# Patient Record
Sex: Female | Born: 1965 | Race: White | Hispanic: No
Health system: Midwestern US, Academic
[De-identification: ages and names within clinical notes are randomized; demographics above are authoritative.]

---

## 2018-05-31 LAB — SED RATE: Lab: 17

## 2018-05-31 LAB — C REACTIVE PROT-HI SENSITIVITY: Lab: 3.4

## 2018-06-20 ENCOUNTER — Encounter: Admit: 2018-06-20 | Discharge: 2018-06-20 | Payer: Commercial Managed Care - HMO | Primary: Family

## 2018-07-03 IMAGING — US DUPRENAL
1 series · 14 of 25 positions shown · non-contrast
Comparison: none

[Series 1: us duplex renal artery · 14 of 54 slices shown]
[im 1/54]
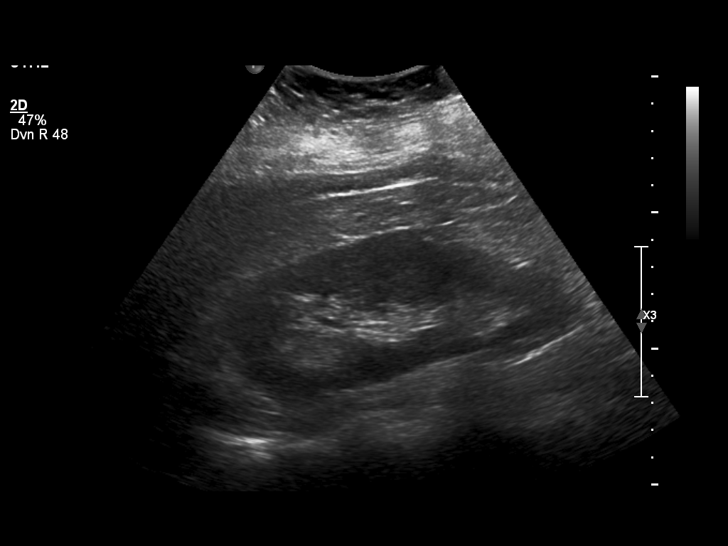
[im 5/54]
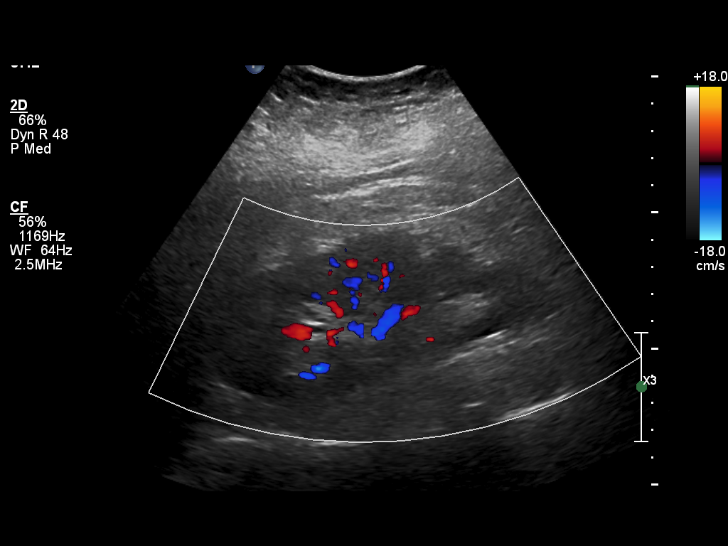
[im 9/54]
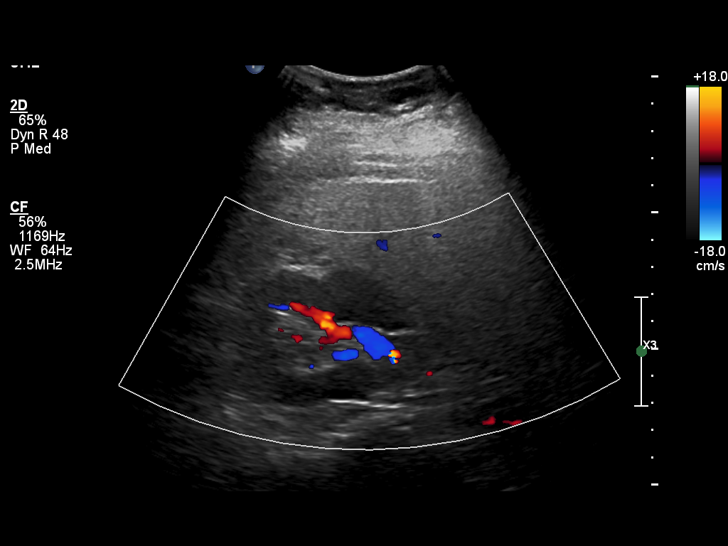
[im 14/54]
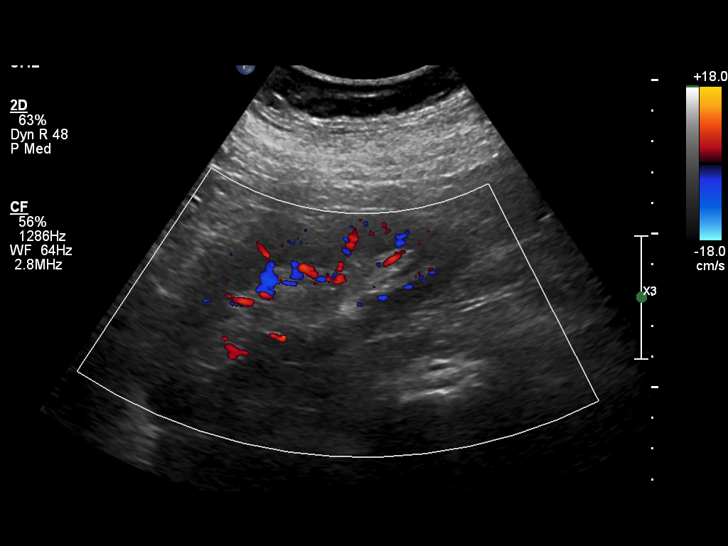
[im 18/54]
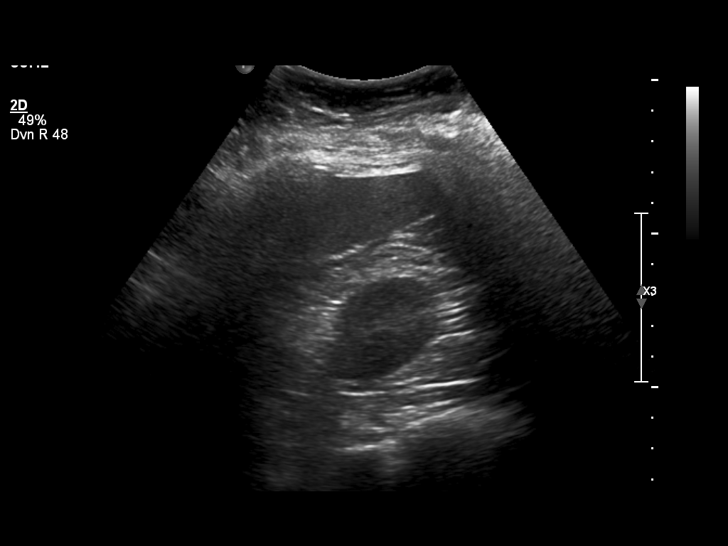
[im 20/54]
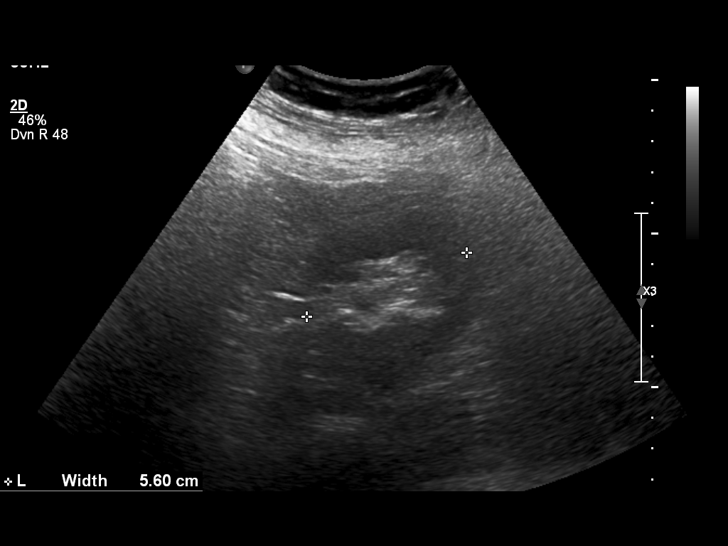
[im 25/54]
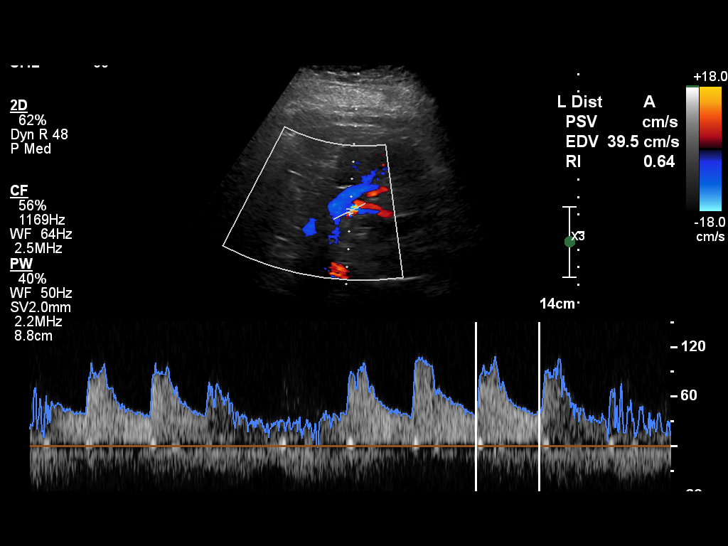
[im 29/54]
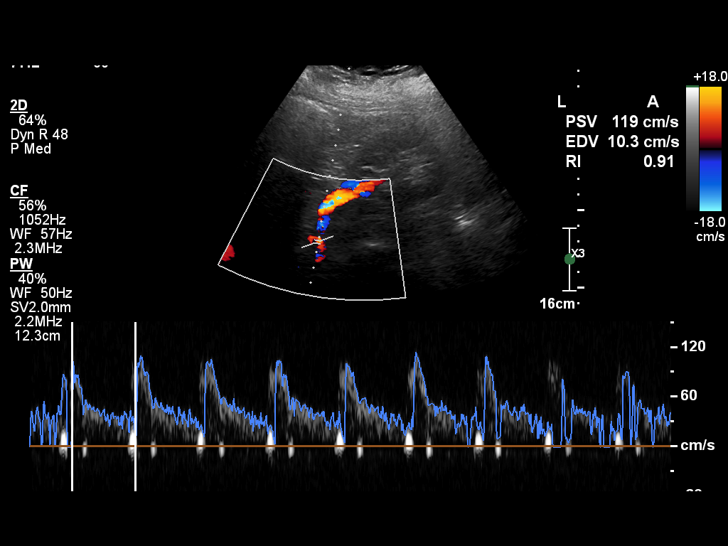
[im 34/54]
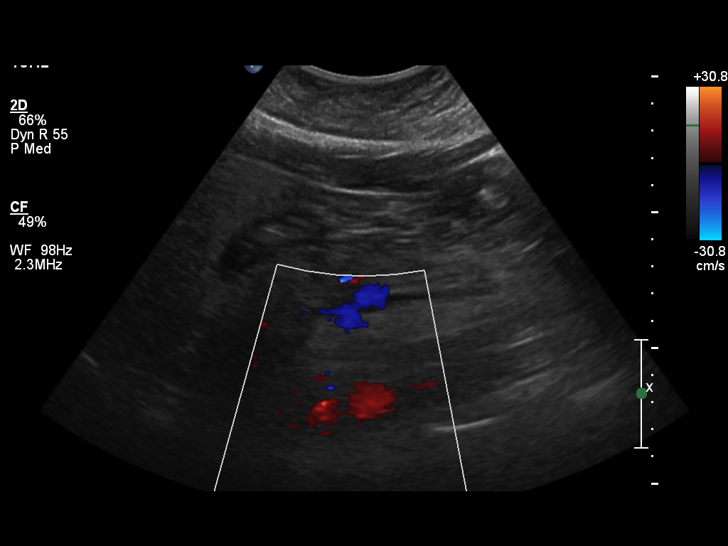
[im 36/54]
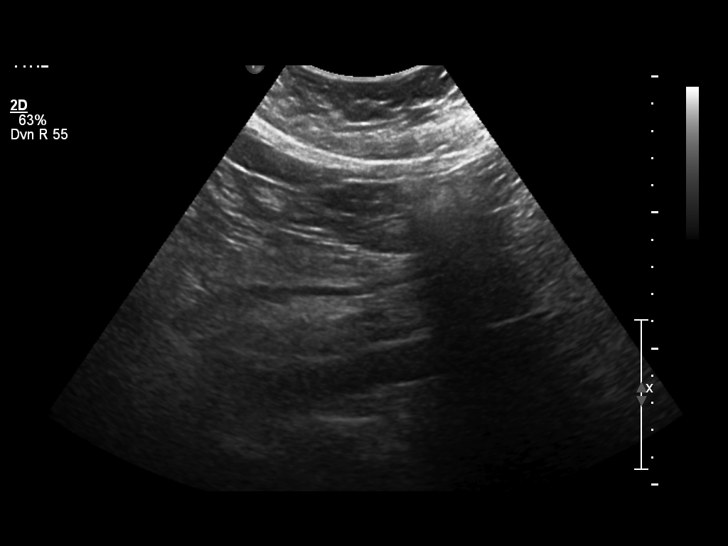
[im 40/54]
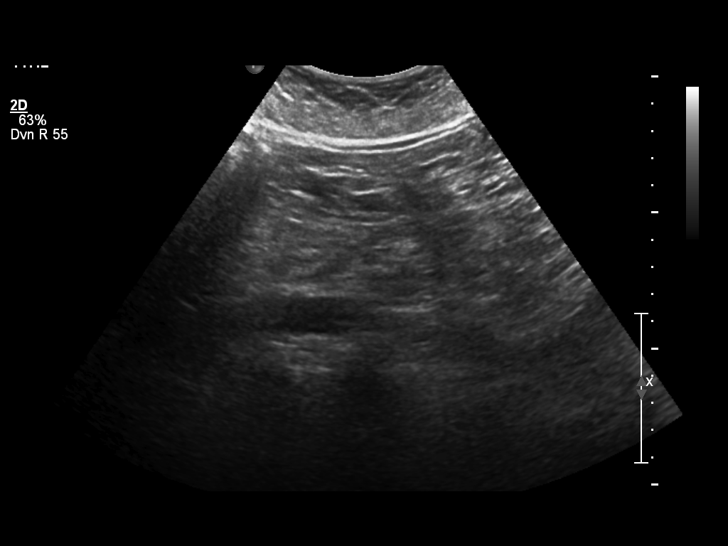
[im 45/54]
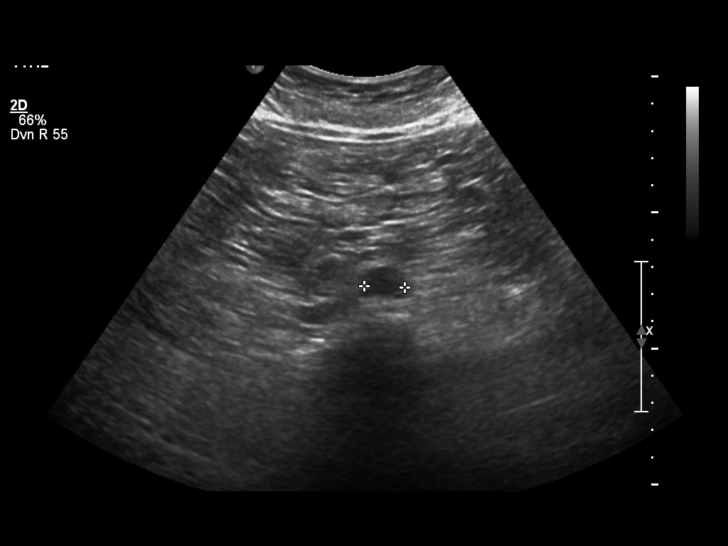
[im 49/54]
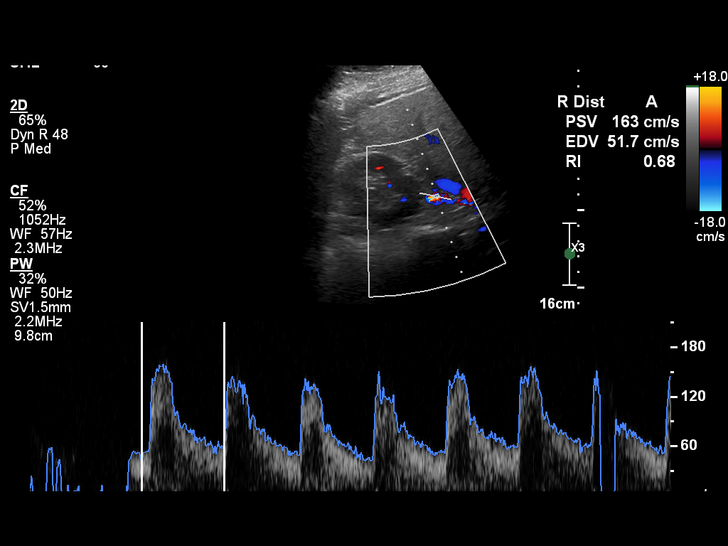
[im 54/54]
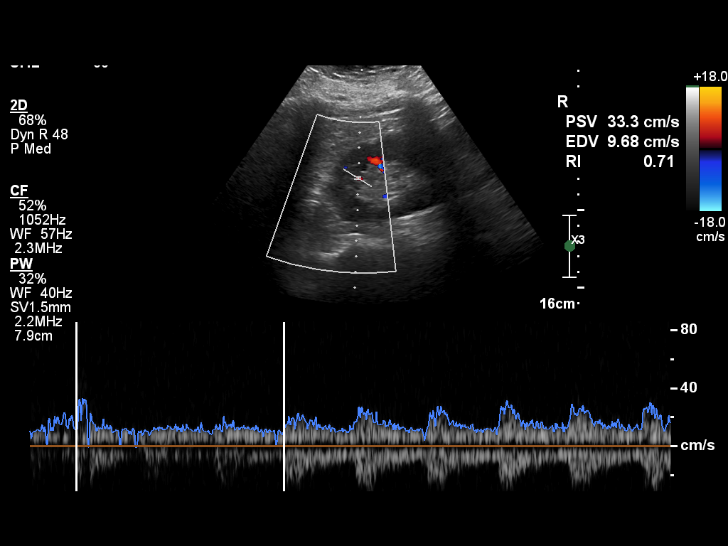

[14 of 25 positions shown; findings below may reference images not displayed]

EXAM
Duplex renal artery ultrasound.

INDICATION
uncontrolled HTN
jl/ao

TECHNIQUE
High-resolution grayscale, color Doppler, and duplex interrogation of the kidneys and renal
arteries.

COMPARISONS
No prior studies are available for comparison.

FINDINGS
The right kidney measures 12.9 x 5.3 x 6.0 centimeters and the left kidney measures 12.3 x 5.0 x
5.6 centimeters. There is no hydronephrosis or renal mass.
Peak systolic velocity measurements are within normal limits, maximal at the origin of the right
renal artery peak systolic velocity documented at 175 centimeters/second correlating with a renal
artery to aorta ratio 1.93. The renal vein is documented to be patent bilaterally.

IMPRESSION
No acute renal pathology. No evidence of hemodynamically significant stenosis of the renal
arteries.

Tech Notes:

jl/ao

## 2019-01-11 LAB — BASIC METABOLIC PANEL
Lab: 1
Lab: 125 — ABNORMAL HIGH (ref 70–105)
Lab: 13
Lab: 140
Lab: 16
Lab: 25
Lab: 61
Lab: 8.6

## 2019-01-11 LAB — THYROID STIMULATING HORMONE-TSH: Lab: 0.2 — ABNORMAL LOW (ref 0.35–4.94)

## 2019-01-11 LAB — FREE T4 (FREE THYROXINE) ONLY: Lab: 1

## 2019-01-19 LAB — COMPREHENSIVE METABOLIC PANEL
Lab: 134 — ABNORMAL LOW (ref 136–145)
Lab: 15 — ABNORMAL HIGH (ref 0–14)
Lab: 23
Lab: 26
Lab: 3.8
Lab: 30
Lab: 61
Lab: 7.7
Lab: 8.5
Lab: 86
Lab: 97 — ABNORMAL LOW (ref 98–107)

## 2019-01-19 LAB — CBC
Lab: 12
Lab: 31 — ABNORMAL HIGH (ref 27.0–31.0)
Lab: 32
Lab: 8.1

## 2019-01-19 IMAGING — CR CHEST
1 series · 1 of 1 positions shown · non-contrast
Comparison: none

[chest port x-wise]
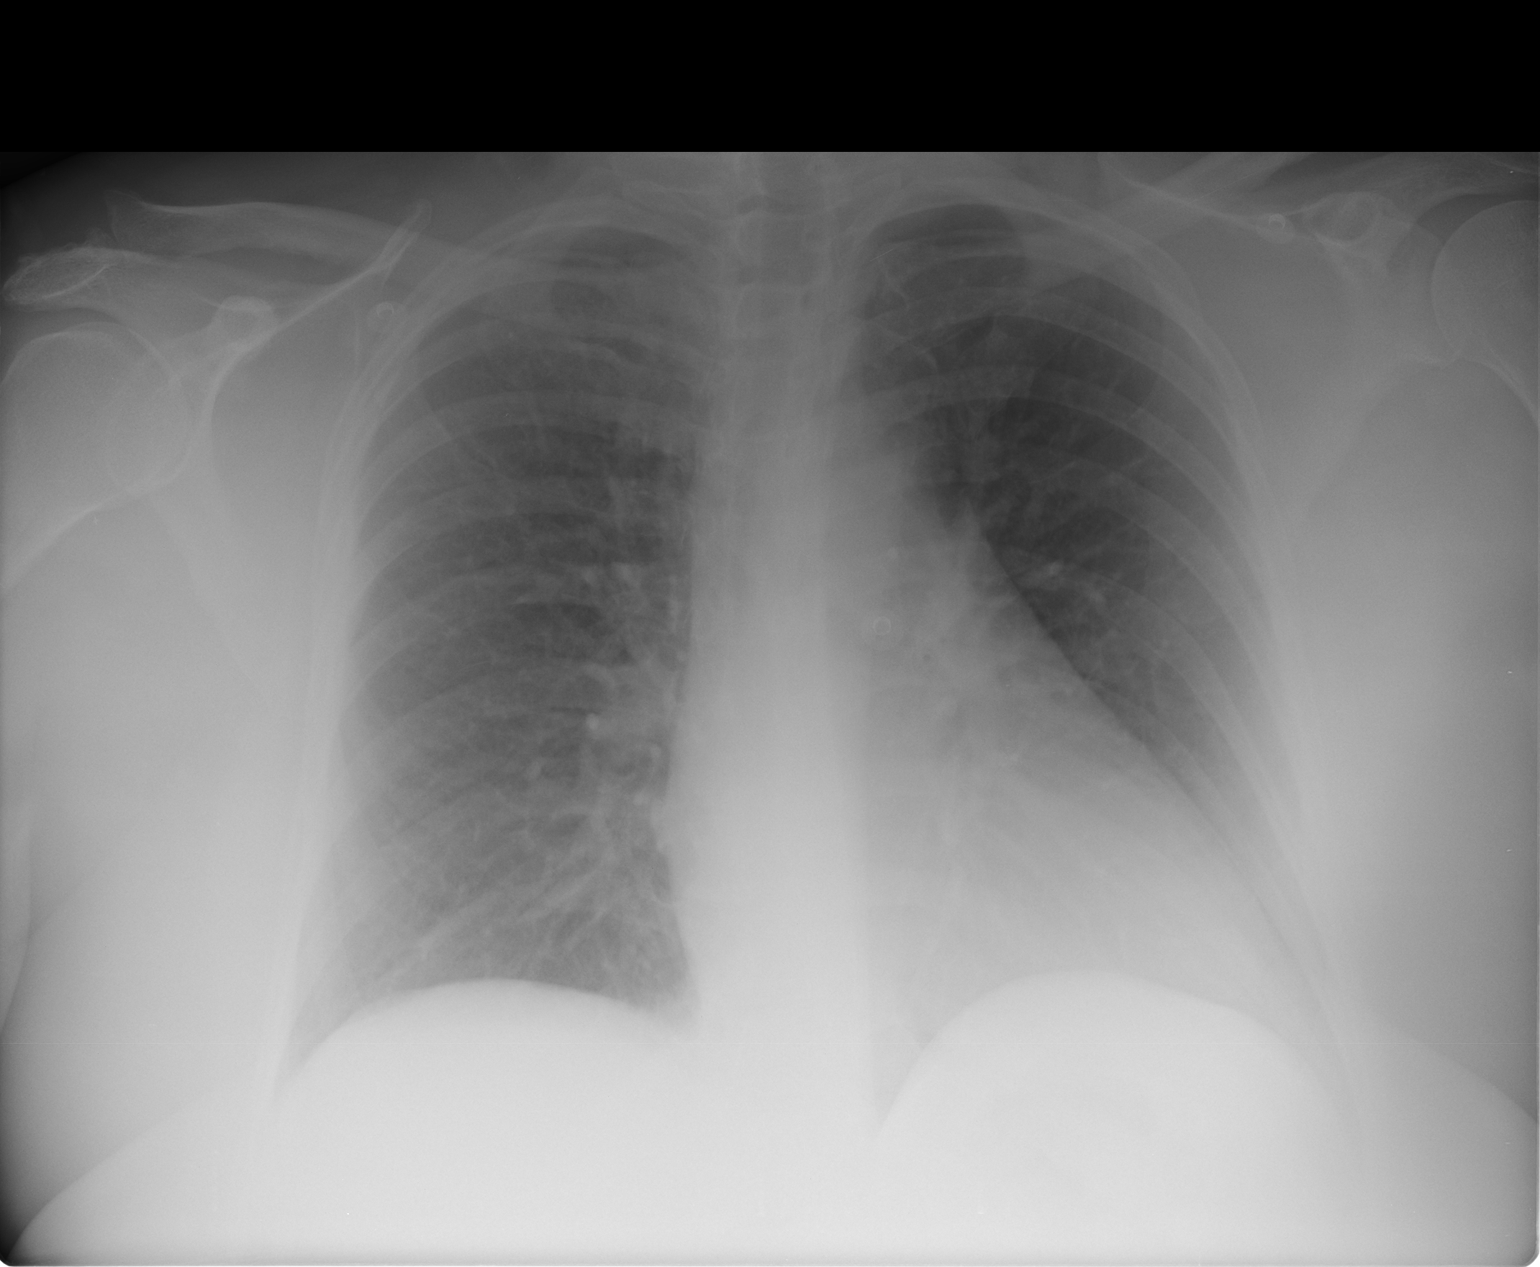

[1 of 1 positions shown; findings below may reference images not displayed]

EXAM
RADIOLOGICAL EXAMINATION, CHEST; SINGLE VIEW, FRONTAL CPT 24444

INDICATION
syncope
COUGH X 3 DAYS. RIB PAIN. HB

TECHNIQUE
1 view of the chest was acquired.

COMPARISONS
There are no previous examinations available for comparison at the time of dictation.

FINDINGS
Single AP portable view of the chest shows no focal consolidation, effusions, or pulmonary edema.
Cardiac and mediastinal contours are unremarkable.

IMPRESSION
No acute cardiopulmonary process.

Tech Notes:

COUGH X 3 DAYS. RIB PAIN. HB

## 2019-01-23 LAB — BASIC METABOLIC PANEL
Lab: 100
Lab: 129 — ABNORMAL HIGH (ref 70–105)
Lab: 13
Lab: 136
Lab: 15 — ABNORMAL HIGH (ref 0–14)
Lab: 24
Lab: 68
Lab: 8.6

## 2019-01-23 LAB — HEMOGLOBIN A1C: Lab: 5.6 — ABNORMAL LOW (ref 3.5–5.1)

## 2019-02-02 ENCOUNTER — Encounter: Admit: 2019-02-02 | Discharge: 2019-02-02 | Payer: Commercial Managed Care - HMO | Primary: Family

## 2019-02-07 ENCOUNTER — Encounter: Admit: 2019-02-07 | Discharge: 2019-02-07 | Payer: Commercial Managed Care - HMO | Primary: Family

## 2019-02-07 DIAGNOSIS — R55 Syncope and collapse: Principal | ICD-10-CM

## 2019-02-07 DIAGNOSIS — I2510 Atherosclerotic heart disease of native coronary artery without angina pectoris: ICD-10-CM

## 2019-02-07 DIAGNOSIS — E039 Hypothyroidism, unspecified: ICD-10-CM

## 2019-02-08 ENCOUNTER — Encounter: Admit: 2019-02-08 | Discharge: 2019-02-08 | Payer: Commercial Managed Care - HMO | Primary: Family

## 2019-02-08 ENCOUNTER — Ambulatory Visit: Admit: 2019-02-08 | Discharge: 2019-02-09 | Payer: Commercial Managed Care - HMO | Primary: Family

## 2019-02-08 DIAGNOSIS — I2510 Atherosclerotic heart disease of native coronary artery without angina pectoris: ICD-10-CM

## 2019-02-08 DIAGNOSIS — E039 Hypothyroidism, unspecified: ICD-10-CM

## 2019-02-08 DIAGNOSIS — R55 Syncope and collapse: Principal | ICD-10-CM

## 2019-02-08 DIAGNOSIS — I10 Essential (primary) hypertension: ICD-10-CM

## 2019-02-08 MED ORDER — SPIRONOLACTONE 25 MG PO TAB
25 mg | ORAL_TABLET | Freq: Every day | ORAL | 3 refills | 46.00000 days | Status: AC
Start: 2019-02-08 — End: 2019-05-03

## 2019-02-08 NOTE — Progress Notes
stenosis of the renal arteries.     ??? Hypothyroid 02/07/2019   ??? CAD (coronary artery disease) 02/07/2019     11/06/12 Heart Cath:  Minimal coronary artery disease LVEDP 23           Review of Systems   Constitution: Negative.   HENT: Negative.    Eyes: Negative.    Cardiovascular: Positive for leg swelling, near-syncope and syncope.   Respiratory: Negative.    Endocrine: Negative.    Hematologic/Lymphatic: Negative.    Skin: Negative.    Musculoskeletal: Positive for back pain and neck pain.   Gastrointestinal: Negative.    Genitourinary: Negative.    Neurological: Positive for headaches.   Psychiatric/Behavioral: Negative.    Allergic/Immunologic: Negative.        Physical Exam    Physical Exam   General Appearance: no distress   Skin: warm, no ulcers or xanthomas   Digits and Nails: no cyanosis or clubbing   Eyes: conjunctivae and lids normal, pupils are equal and round   Teeth/Gums/Palate: dentition unremarkable, no lesions   Lips & Oral Mucosa: no pallor or cyanosis   Neck Veins: normal JVP , neck veins are not distended   Thyroid: no nodules, masses, tenderness or enlargement   Chest Inspection: chest is normal in appearance   Respiratory Effort: breathing comfortably, no respiratory distress   Auscultation/Percussion: lungs clear to auscultation, no rales or rhonchi, no wheezing   PMI: PMI not enlarged or displaced   Cardiac Rhythm: regular rhythm and normal rate   Cardiac Auscultation: S1, S2 normal, no rub, no gallop   Murmurs: no murmur   Peripheral Circulation: normal peripheral circulation   Carotid Arteries: normal carotid upstroke bilaterally, no bruits   Radial Arteries: normal symmetric radial pulses   Abdominal Aorta: no abdominal aortic bruit   Pedal Pulses: normal symmetric pedal pulses   Lower Extremity Edema: no lower extremity edema   Abdominal Exam: soft, non-tender, no masses, bowel sounds normal   Liver & Spleen: no organomegaly   Gait & Station: walks without assistance

## 2019-02-08 NOTE — Patient Instructions
Echocardiogram can be done at the [facility].  Scheduling will call you to schedule.      Tilt Table will be done at [facility] - Scheduling will call you to schedule.

## 2019-05-03 ENCOUNTER — Encounter: Admit: 2019-05-03 | Discharge: 2019-05-03 | Payer: Commercial Managed Care - HMO | Primary: Family

## 2019-05-03 MED ORDER — SPIRONOLACTONE 25 MG PO TAB
25 mg | ORAL_TABLET | Freq: Every day | ORAL | 1 refills | 90.00000 days | Status: DC
Start: 2019-05-03 — End: 2019-12-11

## 2019-08-21 ENCOUNTER — Encounter: Admit: 2019-08-21 | Discharge: 2019-08-21 | Primary: Family

## 2019-08-21 NOTE — Telephone Encounter
Patient contacted for pre-visit information gathering r/t referral for headaches.    Referring provider: 06/06/2019 Dr. Jaclyn Shaggy[facility]   "Patient is here today for increased headaches. She gets them daily. On a good day she gets 2 headaches a day, and on bad days it increases to 5 a day. She usually wakes up with one, it takes her 45 minutes to an hour to come back to a functioning state. The medication she takes to get relief is: Motrin 200,g 3 tan;s. Tu;emp; 650mg  2 tabs all at once. If it is severe enough she will add a Norco to that as well. They are only on her left side of head, above the ear, but she feels the bulging at the temple. If she massages her temple it helps a great deal. IT seems to relieve the H/As. THe H/As are unilateral on the R side of her head behind the R eye. She has some nausea with the H/As. She does not take the Hydrocodone routinely and does not ascribe to my theory that she may be experiencing rebound opioid H/As."    Previous neurologist: denies    Imaging: patient thought maybe she had some imaging at [facility] that is now closed but thought maybe they had been sent to [facility].  [facility] states they do not know where those records would be nor has the patient had any imaging of her brain or spine done at their hospital.       Sleep Study: denies      Recent ED visits (last 6 months) r/t chief complaint: denies      Confirmed appointment for 08/23/2019 at 8:20am.  Also confirmed that patient aware of clinic location, visitor policy, and mask requirement.   `

## 2019-08-23 ENCOUNTER — Encounter: Admit: 2019-08-23 | Discharge: 2019-08-23 | Primary: Family

## 2019-08-23 ENCOUNTER — Ambulatory Visit: Admit: 2019-08-23 | Discharge: 2019-08-24 | Primary: Family

## 2019-08-23 DIAGNOSIS — E039 Hypothyroidism, unspecified: Secondary | ICD-10-CM

## 2019-08-23 DIAGNOSIS — I10 Essential (primary) hypertension: Secondary | ICD-10-CM

## 2019-08-23 DIAGNOSIS — R51 Headache: Secondary | ICD-10-CM

## 2019-08-23 DIAGNOSIS — I2510 Atherosclerotic heart disease of native coronary artery without angina pectoris: Secondary | ICD-10-CM

## 2019-08-23 DIAGNOSIS — R55 Syncope and collapse: Secondary | ICD-10-CM

## 2019-08-23 LAB — COMPREHENSIVE METABOLIC PANEL
Lab: 0.9 mg/dL (ref 0.4–1.00)
Lab: 10 mg/dL (ref 7–25)
Lab: 112 mg/dL — ABNORMAL HIGH (ref 70–100)
Lab: 139 MMOL/L (ref 137–147)
Lab: 4.3 g/dL (ref 3.5–5.0)
Lab: 7.7 g/dL — ABNORMAL HIGH (ref 6.0–8.0)
Lab: 9.4 mg/dL (ref 8.5–10.6)

## 2019-08-23 LAB — SED RATE: Lab: 22 mm/h (ref 0–30)

## 2019-08-23 LAB — CBC AND DIFF: Lab: 9 10*3/uL (ref 4.5–11.0)

## 2019-08-23 LAB — C REACTIVE PROTEIN (CRP): Lab: 0.2 mg/dL (ref <1.0)

## 2019-08-23 MED ORDER — UBROGEPANT 100 MG PO TAB
100 mg | ORAL_TABLET | Freq: Every day | ORAL | 3 refills | 30.00000 days | Status: DC | PRN
Start: 2019-08-23 — End: 2019-10-30

## 2019-08-23 NOTE — Progress Notes
Date of Service: 08/23/2019    Subjective:             No chief complaint on file.    Referring Physician: Steva Ready, MD  Informant: Patient who is a           historian.  Other informant: husband and records from Dr Andreas Newport  Accompanied ZO:XWRUEAV Dorinda Hill    History of Present Illness            April Stein is a 53 y.o. right handed  female with history of HTN , minimal CAD via heart cath 2013  and poorly controlled hypertension x 20 years who presents with persistent headaches since 2018.  Prior to that she had mild headaches with elevated BP. This headache is different though.    Location: left temple  Quality:throbbing pain  which becomes sharp as intensifies  Severity:ranges 6-10; typically it is a 7/10 - she does not know what her BP is when the pain is intense. In the past she did try to track it but there was no direct correlation.   Frequency: 3-4 times a day on a daily basis.   Headache days/month:30   Length without WU:[id number]  Length with tx: an hour or longer  Associated sxs:She has only vomited twice since the onset. She has photophobia and neck pain,   Denies: lacrimation, rhinorrhea, positional changes. ringing in ears/roaring sounds, nausea,  phonophobia, visual loss/scintillations,  numbness, weakness, dizziness, vertigo, ptosis, pupillary changes  Aura:no  Triggers: weather change, eating so she tries to eat only in the evenings  Caffeine intake:32 ounces a day ( Pepsi)  Exercise routine: not much  HA upon awakening: yes  Snoring?sometimes, no known apnea - but husband a hard sleeper  Abortive tx:   Effective: ibuprofen 600 with tylenol 1300 mg at onset may dull the pain in 45 min, if not work then takes 1/2 pill of Norco 5/325 which takes another 1/2 hour to work   Ineffective:fiorinal   Helps partially: applying pressure to her temple   Intolerant:   - Overuse: >15days/mo  PPx tried/length of trial:   verapamil 120 mg ER    amitriptyline 25 mg  qhs - not help   losartan Days of missed work/mo:no  ER visits: 0  Family hx of migraine/family:no  Hx of head injury:no  History of blood clots/blood clotting disorder: no  History of stroke/myocardial infarction: no  History of kidney stones: no  History of glaucoma:  no  History of asthma: no  Depression/Anxiety: no  Last Eye exam: Dec 2018 -  On metformin: no  Iron Studies:     Review of Records:  Previous Imaging:No    His records from Dr Andreas Newport indicated that he was concerned about opioid induced rebound headache for which he was treating with fiorinal and Calan Sr  Possibly head ct at [facility] which has closed down and records are not available.     ______________________________________________________________________________  She   has a past medical history of CAD (coronary artery disease) (02/07/2019), Generalized headaches, Hypertension, Hypothyroid (02/07/2019), and Syncope (02/07/2019).          Surgical History:   Procedure Laterality Date   ??? THYROIDECTOMY  1996   ??? HYSTERECTOMY  1998   ??? CESAREAN SECTION  87/91/94/95        She  reports that she has never smoked. She has never used smokeless tobacco. She reports previous alcohol use. She reports that she does not use drugs.  The family history includes Diabetes in her father; Stroke (age of onset: 22) in her father; Thyroid Disease in her maternal grandmother.    Allergies:    Allergies   Allergen Reactions   ??? Ampicillin SEE COMMENTS     Yeast infections   ??? Bromocriptine SYNCOPE and SEIZURES     Seizure and fainting               Medical History:   Diagnosis Date   ??? CAD (coronary artery disease) 02/07/2019    11/06/12 Heart Cath:  Minimal coronary artery disease LVEDP 23   ??? Generalized headaches    ??? Hypertension    ??? Hypothyroid 02/07/2019   ??? Syncope 02/07/2019         14 point ROS were reviewed.  Review of Systems   HENT: Positive for ear pain.    Neurological: Positive for headaches.   All other systems reviewed and are negative. Objective:         ??? acetaminophen SR (TYLENOL) 650 mg tablet Take 1,300 mg by mouth three times daily.   ??? amitriptyline (ELAVIL) 25 mg tablet Take 25 mg by mouth at bedtime daily.   ??? atenoloL (TENORMIN) 50 mg tablet Take 100 mg by mouth daily.   ??? hydroCHLOROthiazide (HYDRODIURIL) 25 mg tablet Take 25 mg by mouth every morning.   ??? HYDROcodone/acetaminophen (NORCO) 5/325 mg tablet Take 1 tablet by mouth four times daily as needed for Pain   ??? ibuprofen (MOTRIN PO) Take 600 mg by mouth four times daily.   ??? levothyroxine (SYNTHROID) 175 mcg tablet Take 175 mcg by mouth daily 30 minutes before breakfast.   ??? losartan (COZAAR) 100 mg tablet Take 100 mg by mouth daily.   ??? spironolactone (ALDACTONE) 25 mg tablet TAKE ONE TABLET BY MOUTH DAILY. TAKE WITH FOOD.   ??? ubrogepant (UBRELVY) 100 mg tablet Take one tablet by mouth daily as needed. May repeat once after 2 hours based on response.   ??? verapamiL (ISOPTIN) 120 mg tablet Take 120 mg by mouth three times daily.     Vitals:    08/23/19 0817   BP: (!) 173/105   BP Source: Arm, Left Upper   Patient Position: Sitting   Pulse: 65   Temp: 37 ???C (98.6 ???F)   Weight: 109.9 kg (242 lb 3.2 oz)   Height: 175.3 cm (69.02)   PainSc: Six     Body mass index is 35.75 kg/m???.     Physical Exam    General physical exam:  General: Alert, cooperative, no distress, appears stated age   Fundoscopic exam: No papilledema but right side not well seen   HEENT: Normocephalic, eyes open with no discharge, nares patent, oropharynx is clear with no lesions, palate intact; tender to palpation especially vein on the left temple. No induration or erythema.   CV: Regular rate and rhythm, no murmur, distal pulses palpable  Neck: No carotid bruits.  Extremities: Normal, atraumatic, no cyanosis, clubbing or edema   Skin: No rashes or lesions     Neurological examination:   Mental status: Alert, oriented to time, person, place and situation Speech: Normal fluency, comprehension, articulation, repetition and naming. Fund of knowledge was age appropriate.  Memory, attention span and concentration were normal.     Cranial Nerves:   Cranial Nerve II Right Left   Visual Acuity      far vision c/ cc 20/25  20/30 -2   near vision c/cc     Amsler  grid      Color Vision         CN II, III, IV, VI: visual fields intact, pupils 3 mm, round and reactive to light, extraocular movements intact with no saccadic intrusions, convergence and accommodation are normal, no nystagmus, no internuclear ophthalmoplegia. Fundoscopic exam normal with no evidence of papilledema  CN V: normal facial sensation in ophthalmic, maxillary, and mandibular dermatomes, muscles of mastication 5/5 bilaterally   CN VII: no facial droop, eye closure and eyebrow elevation symmetric/synchronous, Orbicularis oculi and Orbicularis Oris strength is 5/5 ; except slightly wider left palpebral fissure  CN VIII: hearing grossly intact bilaterally to finger rub   CN IX, X: symmetric palatal elevation   CN XI: trapezii and sternocleidomastoids 5/5 bilaterally   CN XII: tongue midline, 5/5 strength bilaterally, no fasciculations     Motor: no bradykinesia, normal tone, normal bulk, no atrophy/fasciculations, no resting, postural, or action tremor   No pronator arm drift  Upper Extremity Muscle  Right  Left  Lower Extremity Muscle  Right  Left    Shoulder abductors  5  5  Hip flexors  5  5    Elbow flexors  5  5  Hip extensor      Elbow extensors  5  5  Hip abductors      Wrist flexors  5  5  Hip adductors      Wrist extensors  5  5  Knee flexors  5  5    Finger flexors  5  5  Knee extensors  5  5    Finger extensors  5  5  Ankle dorsiflexors  5  5    Finger abductors  5  5  Ankle plantar flexors  5  5    Grip 5  5  Ankle inversion      Thumb abductors    Ankle eversion         Toe flexor  5 5      Toe extensors  5 5     Sensory: Intact to light touch, pin prick, vibration ( felt 12 seconds in great toes ) and proprioception. Romberg sign was negative.  No stocking glove loss to pinprick.       Reflexes:   Reflex  Right  Left    Triceps  2 2   Biceps  2 2    Brachioradialis  1  1    Patella  1 1    Ankle  1 0   Plantar  downgoing  downgoing      Coordination: Normal finger to nose, fine finger movements, heel to shin, rapid alternating movements and toe tapping.    Gait: Normal-based, good arm swing, no flexor posturing, heel walk, toe walk, tandem gait were normal.       Sodium   Date Value Ref Range Status   01/23/2019 136  Final     Potassium   Date Value Ref Range Status   01/23/2019 3.4 (L) 3.5 - 5.1 Final     Chloride   Date Value Ref Range Status   01/23/2019 100  Final     CO2   Date Value Ref Range Status   01/23/2019 24.0  Final     Anion Gap   Date Value Ref Range Status   01/23/2019 15 (H) 0 - 14 Final     Blood Urea Nitrogen   Date Value Ref Range Status   01/23/2019 13.0  Final  Creatinine   Date Value Ref Range Status   01/23/2019 0.92  Final     Glucose   Date Value Ref Range Status   01/23/2019 129 (H) 70 - 105 Final     Calcium   Date Value Ref Range Status   01/23/2019 8.6  Final     AST (SGOT)   Date Value Ref Range Status   01/19/2019 23  Final     ALT (SGPT)   Date Value Ref Range Status   01/19/2019 30  Final     Alk Phosphatase   Date Value Ref Range Status   01/19/2019 86  Final     Albumin   Date Value Ref Range Status   01/19/2019 3.8  Final     Total Bilirubin   Date Value Ref Range Status   01/19/2019 0.60  Final     TSH   Date Value Ref Range Status   01/11/2019 0.21 (L) 0.35 - 4.94 Final     Hemoglobin A1C   Date Value Ref Range Status   01/23/2019 5.6  Final                   Assessment and Plan:    1. Chronic daily headache    2. Left temporal headache        This is a pleasant 53 year old man with history of chronic daily headaches always in the left temporal area. Because the pain stays in the left temporal area always in the same area, it is prudent to obtain an imaging study to exclude a focal pathology especially in a woman of her age. Because her BP is very high, she is not a candidate for triptans. NSAIDs have not helped thus far. We will try Ubrelvy to see if this would help will we are working her up.    Of concern is the left temporal tenderness so vasculitis is in the differential.     We discussed importance of keeping her BP controlled.. Need to also rule out a bleed.         Orders Placed This Encounter   ??? MRI HEAD WO/W CONTRAST   ??? CTA HEAD WO/W CONTR+POST PRO   ??? SED RATE today   ??? C REACTIVE PROTEIN (CRP)   ??? COMPREHENSIVE METABOLIC PANEL today   ??? CBC+DIFF today   ??? ubrogepant (UBRELVY) 100 mg tablet     Encounter Medications   Medications   ??? verapamiL (ISOPTIN) 120 mg tablet     Sig: Take 120 mg by mouth three times daily.   ??? ubrogepant (UBRELVY) 100 mg tablet     Sig: Take one tablet by mouth daily as needed. May repeat once after 2 hours based on response.     Dispense:  10 tablet     Refill:  3     Patient Instructions   Take Ubrelvy 100 mg at onset of your headache, may repeat in 2 hours    Increase the elavil to 50 mg at night. Start when you are not working the next day since it may take a couple of hours to adjust.       Return : Return in about 3 months (around 11/22/2019), or HAs - intractable, for same day as MRI and CTA if possible.   Follow up on Visit date not found       Thank you for allowing [place] to participate in the care of your patient.  I have obtained and reviewed notes from the referring providers and have summarized them.  I have reviewed and summarized all the labs available and have discussed them with the patient  I have reviewed all the medical studies available and have discussed them with the patient  I will discuss my recommendations with other providers involved in the care of this patient by sending them a copy of my note. Regarding: Diagnosis, prognosis and management    Please note: The some of the content of my  HPI, Assessment and Plan, and Patient Instructions in the after visit summary of this encounter were generated via Dragon (TM) voice to text dictation system.  In spite of my best efforts to edit this document to eliminate Dragon (TM) related misstatements, some errors may persist.  The patience and understanding of the reader is requested.  ???

## 2019-08-23 NOTE — Patient Instructions
Take Ubrelvy 100 mg at onset of your headache, may repeat in 2 hours    Increase the elavil to 50 mg at night. Start when you are not working the next day since it may take a couple of hours to adjust.

## 2019-09-19 ENCOUNTER — Ambulatory Visit: Admit: 2019-09-19 | Discharge: 2019-09-19 | Payer: Commercial Managed Care - HMO | Primary: Family

## 2019-09-19 ENCOUNTER — Encounter: Admit: 2019-09-19 | Discharge: 2019-09-19 | Payer: Commercial Managed Care - HMO | Primary: Family

## 2019-09-19 DIAGNOSIS — R519 Chronic daily headache: Secondary | ICD-10-CM

## 2019-09-19 MED ORDER — IOHEXOL 350 MG IODINE/ML IV SOLN
70 mL | Freq: Once | INTRAVENOUS | 0 refills | Status: CP
Start: 2019-09-19 — End: ?
  Administered 2019-09-19: 13:00:00 70 mL via INTRAVENOUS

## 2019-09-19 MED ORDER — SODIUM CHLORIDE 0.9 % IJ SOLN
50 mL | Freq: Once | INTRAVENOUS | 0 refills | Status: CP
Start: 2019-09-19 — End: ?
  Administered 2019-09-19: 13:00:00 50 mL via INTRAVENOUS

## 2019-09-19 MED ORDER — GADOBENATE DIMEGLUMINE 529 MG/ML (0.1MMOL/0.2ML) IV SOLN
20 mL | Freq: Once | INTRAVENOUS | 0 refills | Status: CP
Start: 2019-09-19 — End: ?
  Administered 2019-09-19: 12:00:00 20 mL via INTRAVENOUS

## 2019-10-03 ENCOUNTER — Encounter: Admit: 2019-10-03 | Discharge: 2019-10-03 | Payer: Commercial Managed Care - HMO | Primary: Family

## 2019-10-03 NOTE — Telephone Encounter
Prior Auth submitted for Ubrelvy 100mg . Determination pending.

## 2019-10-29 ENCOUNTER — Encounter: Admit: 2019-10-29 | Discharge: 2019-10-29 | Payer: Commercial Managed Care - HMO | Primary: Family

## 2019-10-29 NOTE — Telephone Encounter
Dr.Hairston, Dr.Eplee called and left a message stating that he has a couple questions to speak with you about April Stein.  Office # [phone], I also have a cell phone number that was left with the message.

## 2019-10-30 MED ORDER — AIMOVIG AUTOINJECTOR 140 MG/ML SC ATIN
140 mg | SUBCUTANEOUS | 11 refills | 28.00000 days | Status: AC
Start: 2019-10-30 — End: ?

## 2019-10-30 MED ORDER — RIMEGEPANT 75 MG PO TBDI
75 mg | ORAL_TABLET | Freq: Every day | ORAL | 3 refills | Status: AC | PRN
Start: 2019-10-30 — End: ?

## 2019-11-05 ENCOUNTER — Encounter: Admit: 2019-11-05 | Discharge: 2019-11-05 | Payer: Commercial Managed Care - HMO | Primary: Family

## 2019-11-05 NOTE — Telephone Encounter
Prior auth. submitted for Nurtec 75mg  tab. Authorization pending.

## 2019-11-07 ENCOUNTER — Encounter: Admit: 2019-11-07 | Discharge: 2019-11-07 | Payer: Commercial Managed Care - HMO | Primary: Family

## 2019-11-09 ENCOUNTER — Encounter: Admit: 2019-11-09 | Discharge: 2019-11-09 | Payer: Commercial Managed Care - HMO | Primary: Family

## 2019-11-09 NOTE — Telephone Encounter
NURTEC odt 75mg  tab [place] did not meet the criteria neccessay to approve the medication. This statement was in a letter form sent to [place] that had been reviewed by the [facility] that reviewed the additional information that was faxed to the insurance co.      Request ID [id number]  Auto fax case ID [id number]

## 2019-12-11 ENCOUNTER — Encounter: Admit: 2019-12-11 | Discharge: 2019-12-11 | Payer: Commercial Managed Care - HMO | Primary: Family

## 2019-12-11 MED ORDER — SPIRONOLACTONE 25 MG PO TAB
25 mg | ORAL_TABLET | Freq: Every day | ORAL | 1 refills | 90.00000 days | Status: DC
Start: 2019-12-11 — End: 2020-06-12

## 2020-01-01 ENCOUNTER — Encounter: Admit: 2020-01-01 | Discharge: 2020-01-01 | Payer: Commercial Managed Care - HMO | Primary: Family

## 2020-06-12 ENCOUNTER — Encounter: Admit: 2020-06-12 | Discharge: 2020-06-12 | Payer: Commercial Managed Care - HMO | Primary: Family

## 2020-06-12 MED ORDER — SPIRONOLACTONE 25 MG PO TAB
ORAL_TABLET | Freq: Every day | ORAL | 3 refills | 90.00000 days | Status: AC
Start: 2020-06-12 — End: ?

## 2020-09-03 IMAGING — US ABDLM
1 series · 14 of 25 positions shown · non-contrast
Comparison: none

[Series 1: us abdomen limited · 14 of 83 slices shown]
[im 1/83]
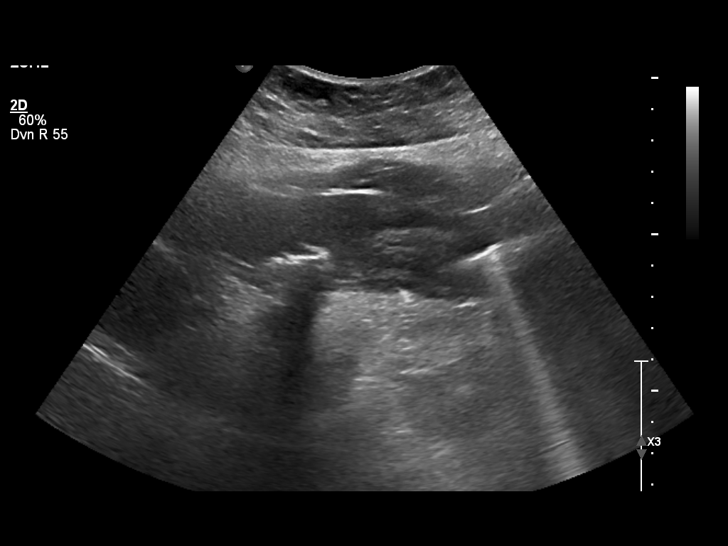
[im 7/83]
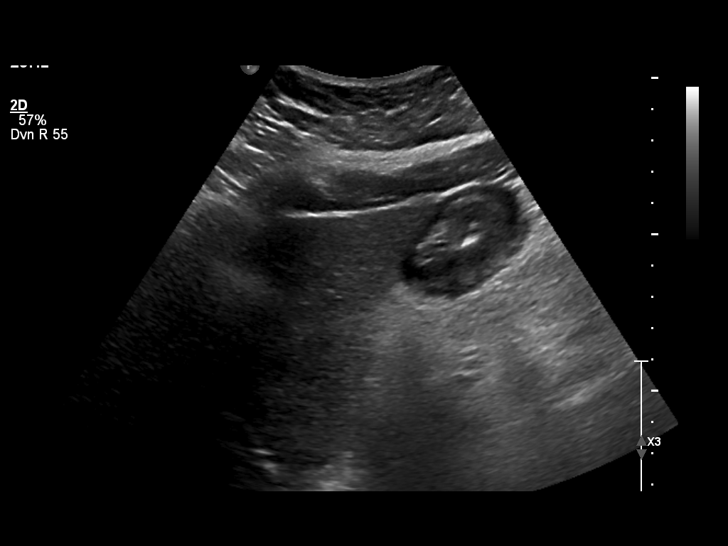
[im 14/83]
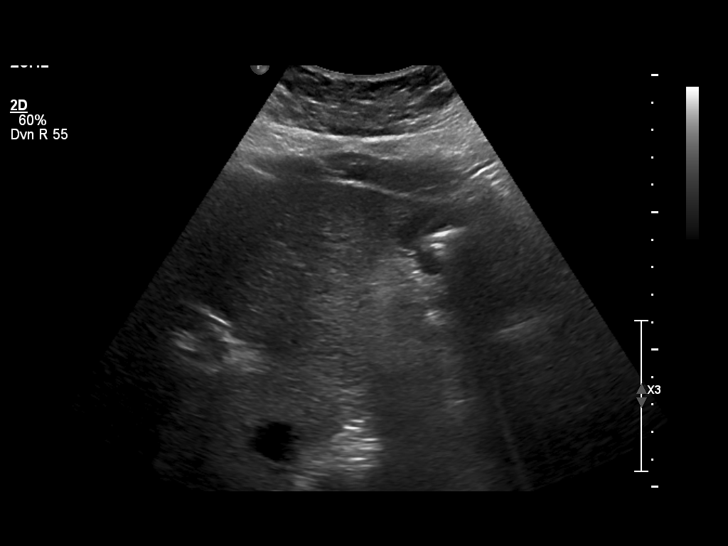
[im 21/83]
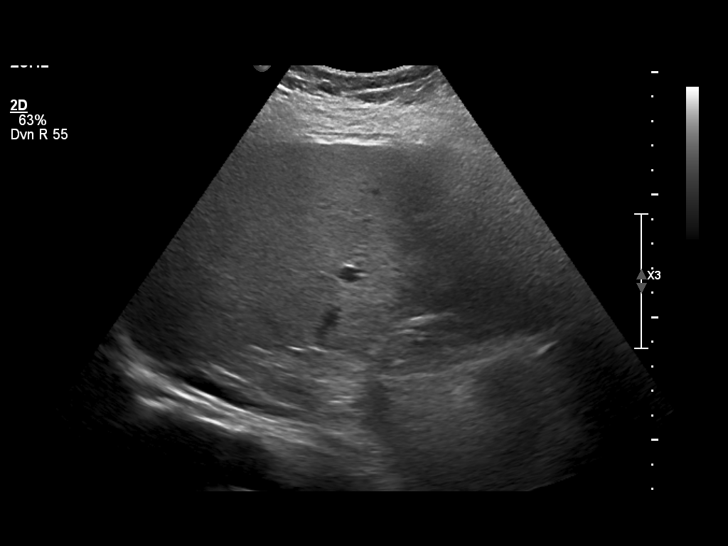
[im 28/83]
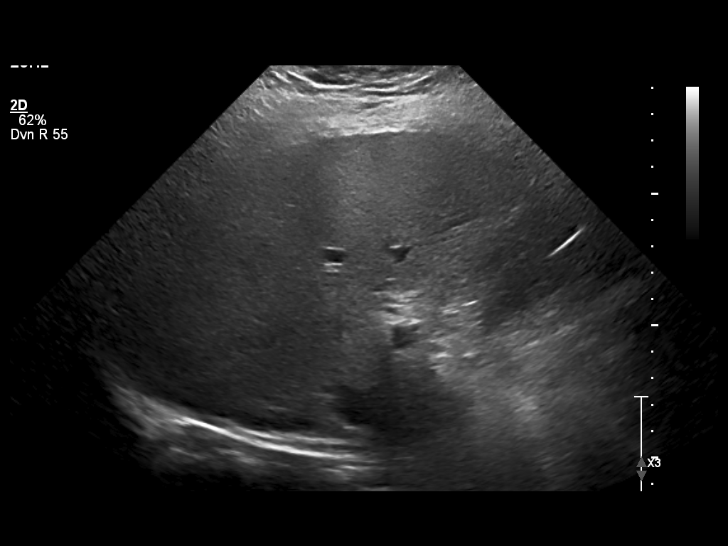
[im 31/83]
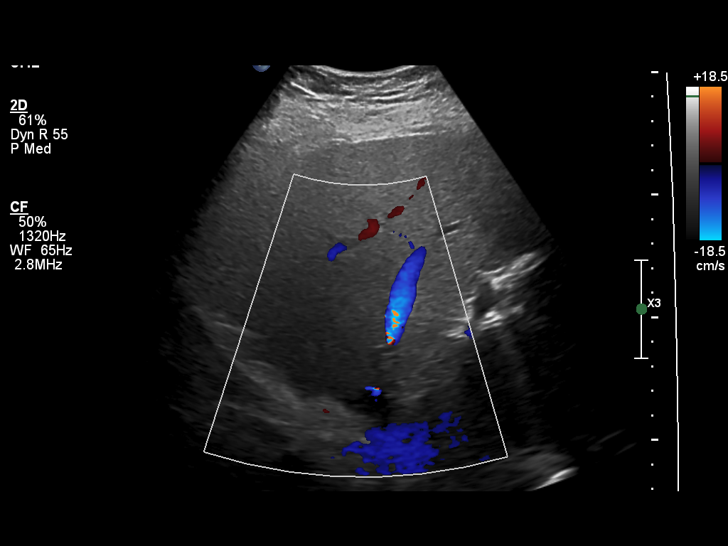
[im 38/83]
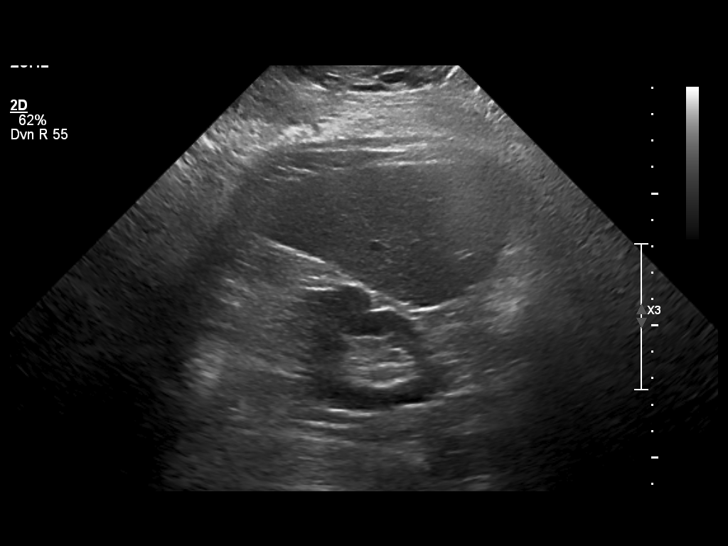
[im 45/83]
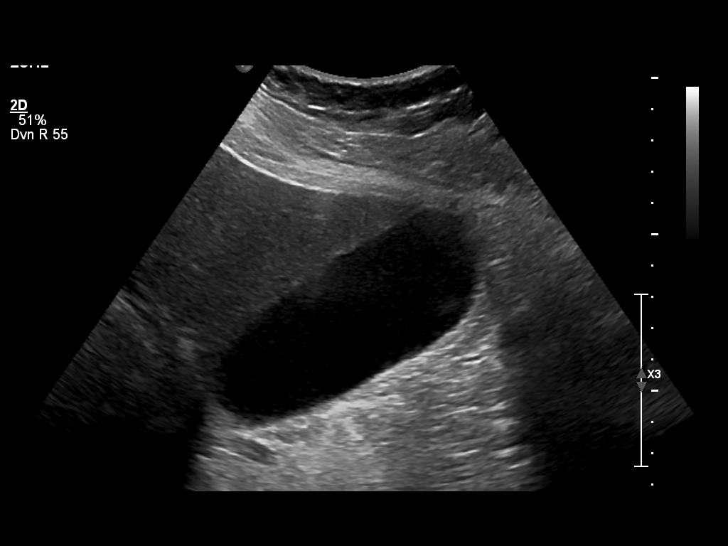
[im 52/83]
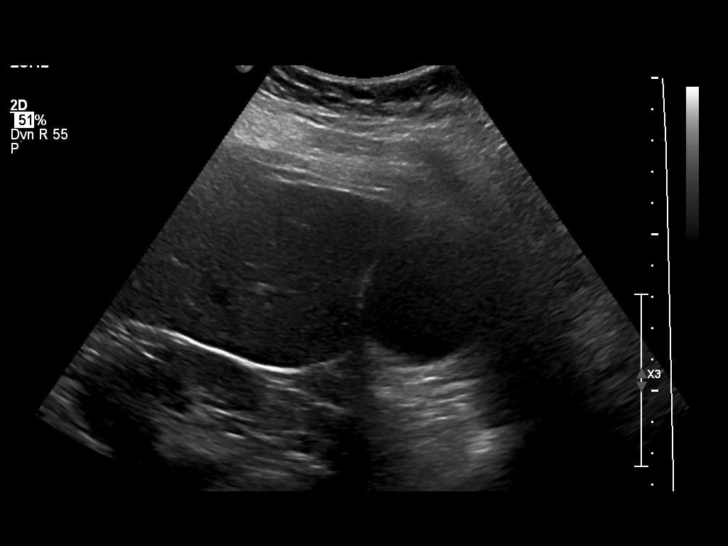
[im 55/83]
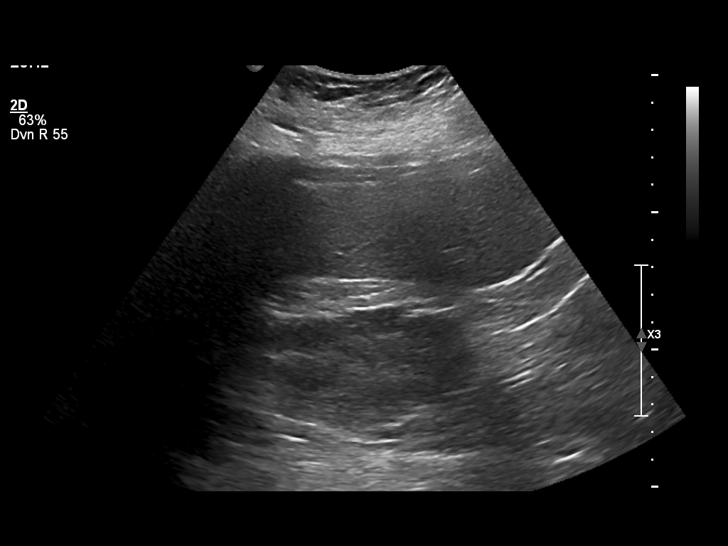
[im 62/83]
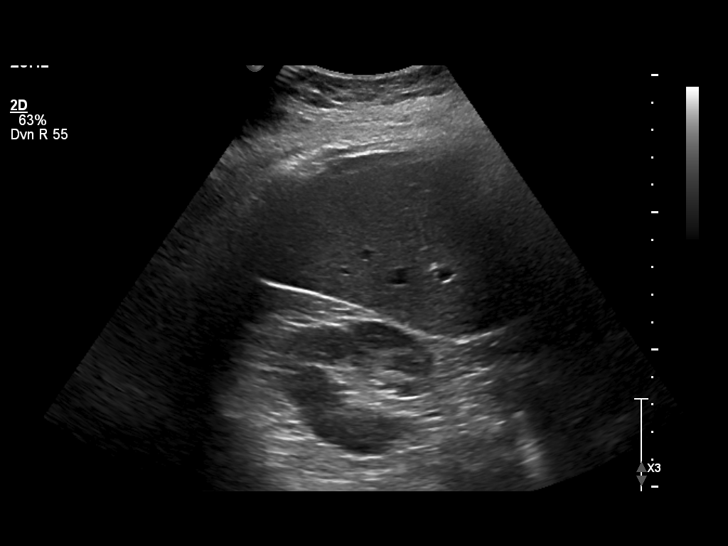
[im 69/83]
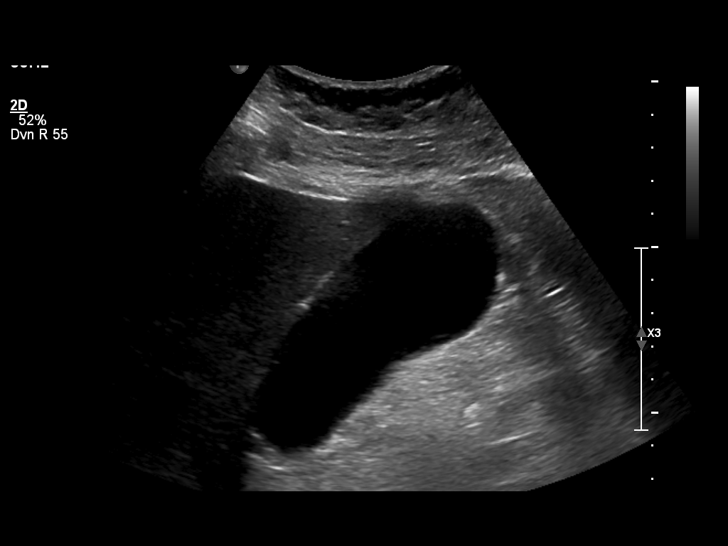
[im 76/83]
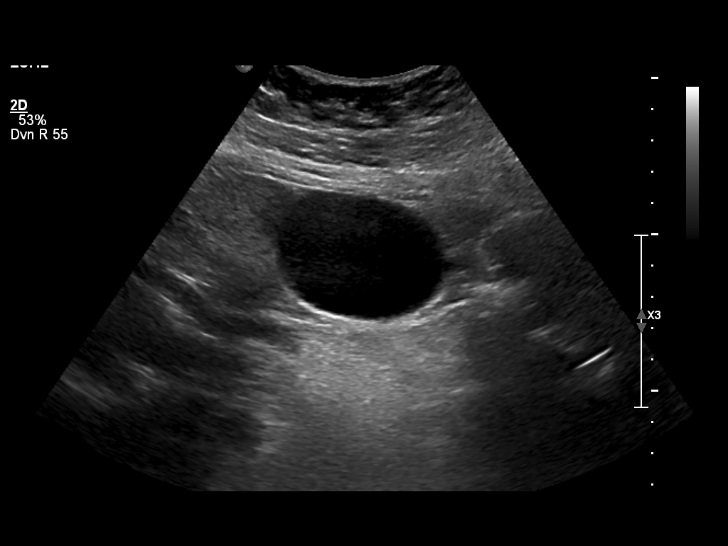
[im 83/83]
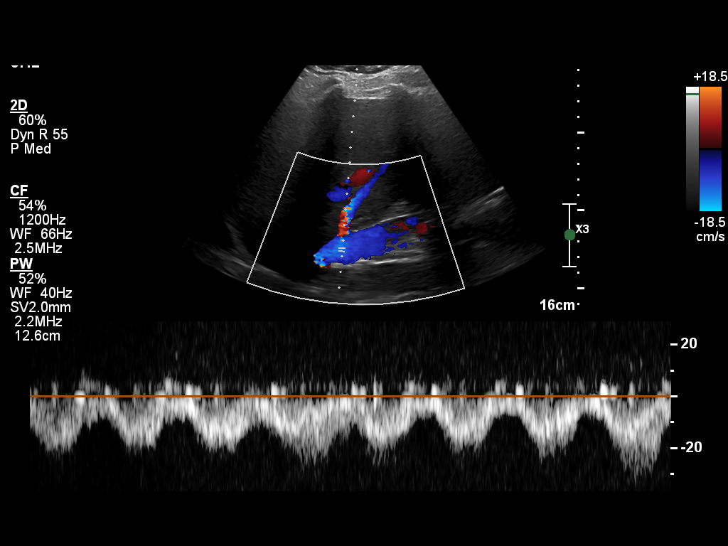

[14 of 25 positions shown; findings below may reference images not displayed]

EXAM

US ABDOMEN COMPLETE

INDICATION

Abdominal pain with nausea and vomiting

TECHNIQUE

Gray and color doppler sonographic imaging evaluation of the abdomen was performed

COMPARISONS

None available at the time of dictation.

FINDINGS

Liver: 195 cm in length, normal. The echogenicity is increased. No suspicious focal lesion. Normal
hepatopedal portal venous flow.

Pancreas: The visualized portions are unremarkable.

Bile ducts: Common bile duct measures 0.4 cm, within normal limits. No intrahepatic or extrahepatic
biliary dilation.

Gallbladder: The gallbladder wall measures 0.2 cm, normal. No cholelithiasis or biliary sludge. No
pericholecystic fluid or hyperemia. Negative sonographic Murphy's sign.

Right Kidney: 11.4 x 4.9 x 5 cm, normal in size. Normal echogenicity. No hydronephrosis.

IVC: Patent.

Abdominal aorta: No aneurysmal dilation. Largest dimensions measure 2.1 x 2 cm, normal.

Ascites: Absent.

IMPRESSION
1. No sonographic abnormality of the visualized portions of the abdomen.

Tech Notes:

TB

## 2020-09-16 IMAGING — NM NM HIDA
1 series · 6 of 6 positions shown · non-contrast
Comparison: none

[flow · 4.52mm/px · 6 of 49 frames shown]
[frame 5/49]
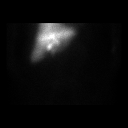
[frame 13/49]
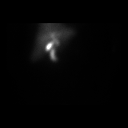
[frame 21/49]
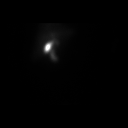
[frame 29/49]
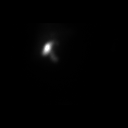
[frame 37/49]
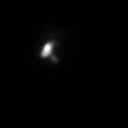
[frame 45/49]
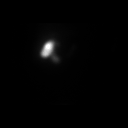

[6 of 6 positions shown; findings below may reference images not displayed]

DIAGNOSTIC STUDIES

EXAM

Nuclear medicine HIDA study.

INDICATION

Gallbladder concerns
5.4 mCi KcFFm Mebrofenin @ 1541. 8 oz Ensure-pt imaged 30 min post prandial. Pt c/o RUQ pain,
nausea, vomiting and diarrhea x 3 weeks. ME

TECHNIQUE

Exam was obtained following administration of 5.4 millicuries of Choletec. Subsequent fatty medial
was administered 30 minutes after injection.

COMPARISONS

None available

FINDINGS

There is normal uptake of radionuclide throughout the liver. Gallbladder is visualized at 5
minutes. Subsequent administration of fatty medial demonstrates overall ejection fraction 76
percent.

IMPRESSION

Normal hepatobiliary scan and gallbladder ejection fraction.

Tech Notes:

## 2021-11-10 IMAGING — MR Head^Brain
1 of 2 series · 46 of 48 positions shown · non-contrast
Comparison: none

[Series 4: cow · axial · 0.8mm · 0.56mm/px · z∈[-58,+12]mm · 46 of 90 slices shown]
[im 1/90]
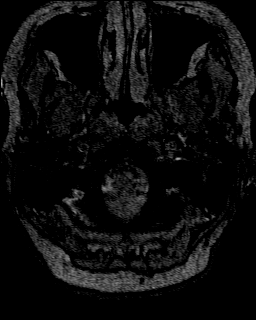
[im 2/90]
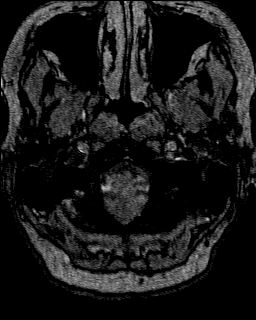
[im 4/90]
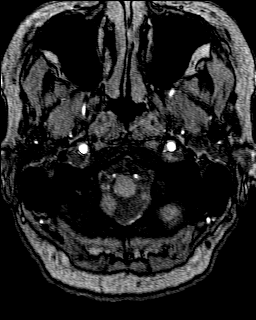
[im 6/90]
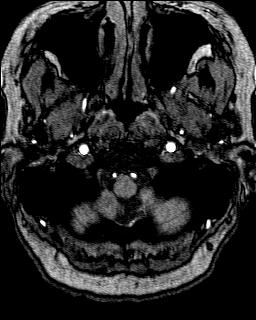
[im 8/90]
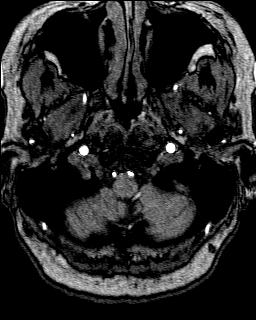
[im 10/90]
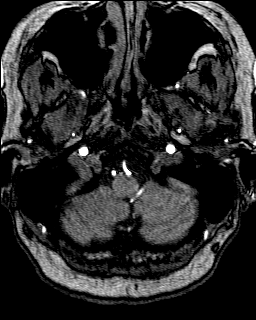
[im 12/90]
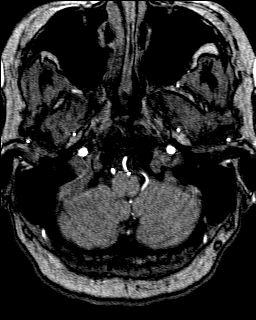
[im 14/90]
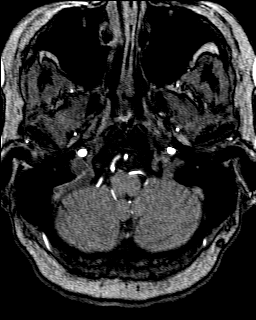
[im 16/90]
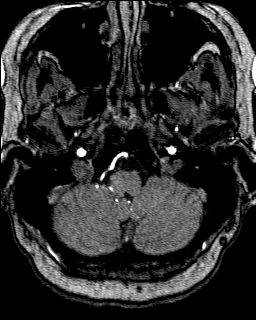
[im 18/90]
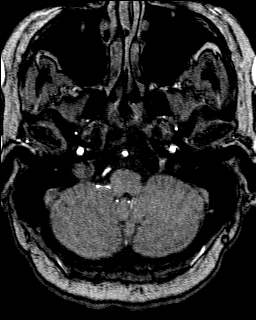
[im 20/90]
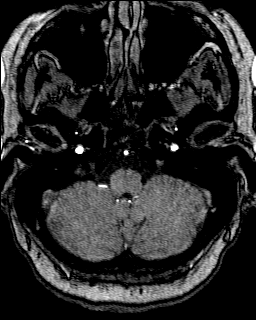
[im 22/90]
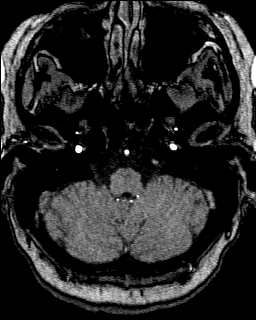
[im 24/90]
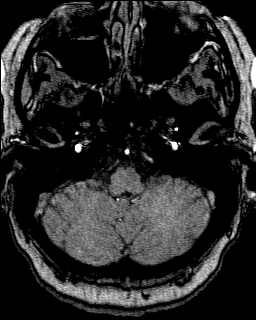
[im 26/90]
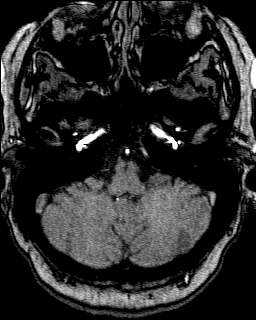
[im 28/90]
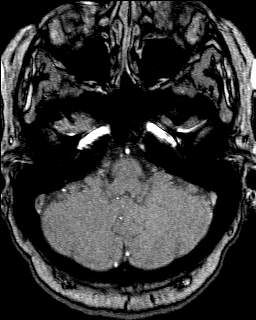
[im 30/90]
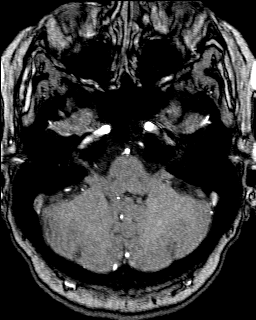
[im 32/90]
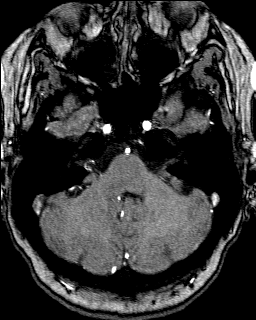
[im 34/90]
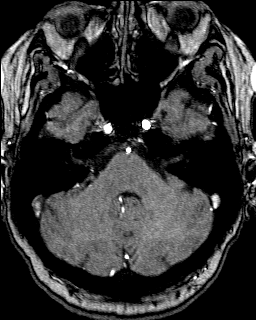
[im 36/90]
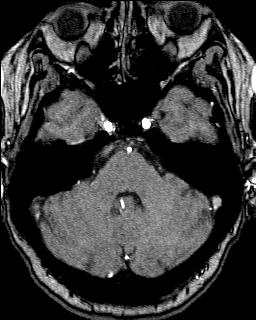
[im 38/90]
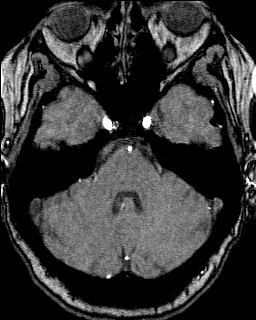
[im 40/90]
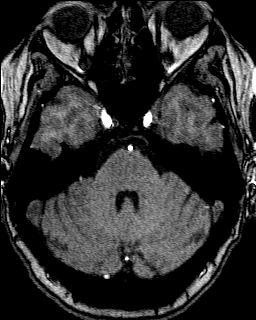
[im 42/90]
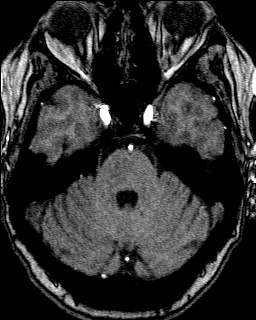
[im 44/90]
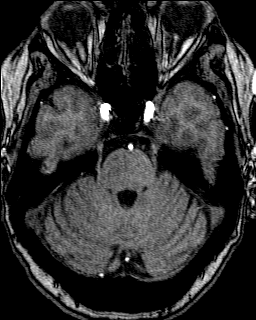
[im 46/90]
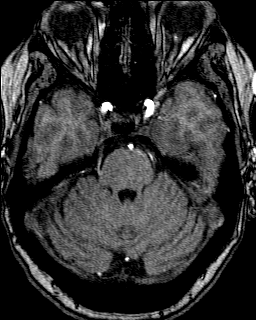
[im 48/90]
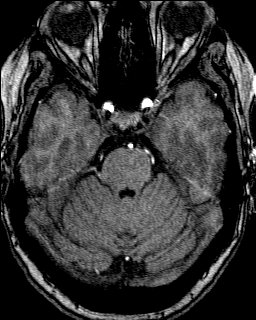
[im 50/90]
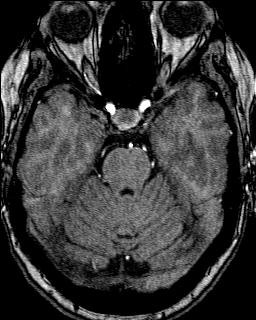
[im 52/90]
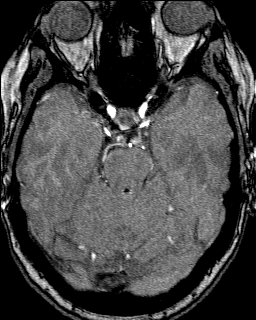
[im 54/90]
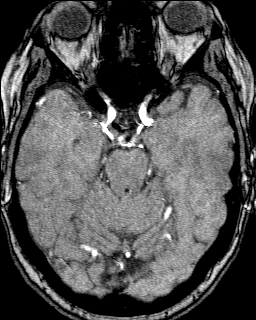
[im 56/90]
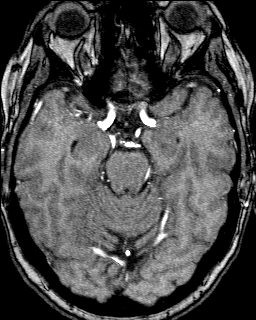
[im 58/90]
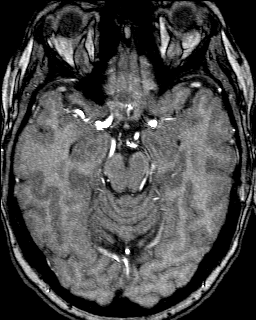
[im 60/90]
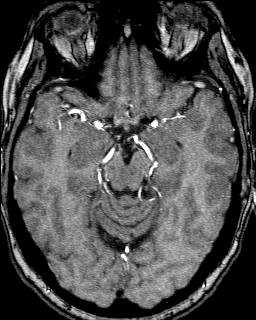
[im 62/90]
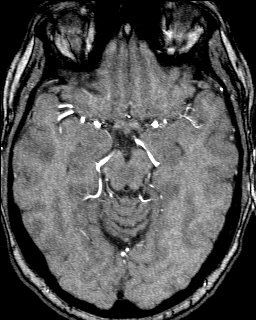
[im 64/90]
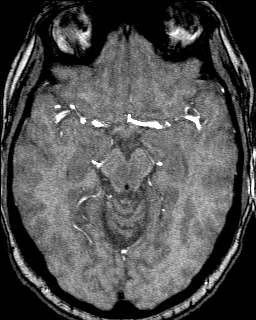
[im 66/90]
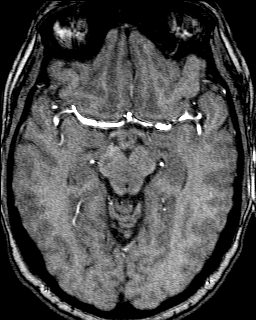
[im 68/90]
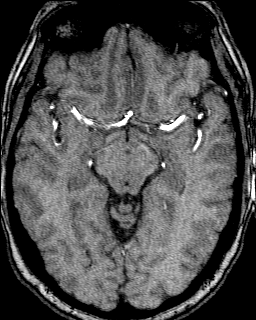
[im 70/90]
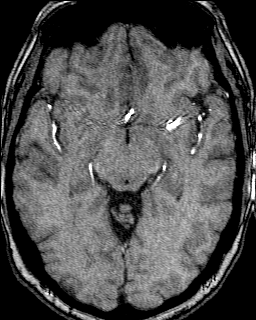
[im 72/90]
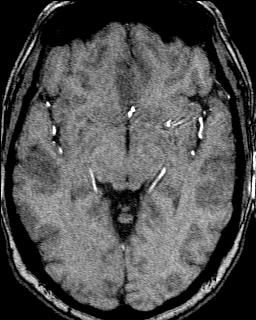
[im 74/90]
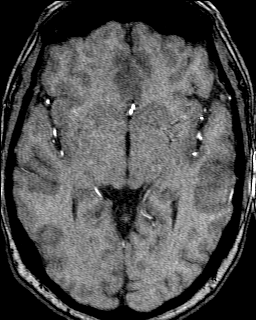
[im 76/90]
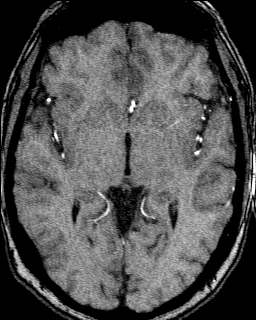
[im 78/90]
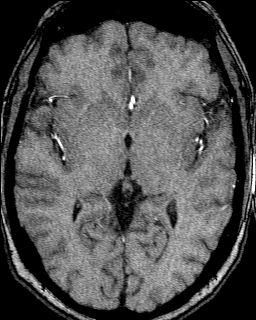
[im 80/90]
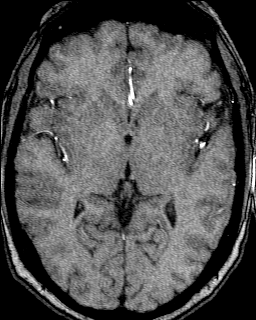
[im 82/90]
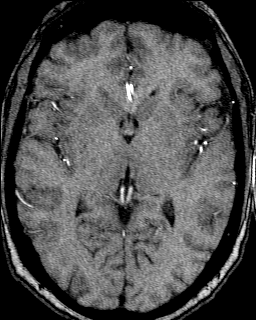
[im 84/90]
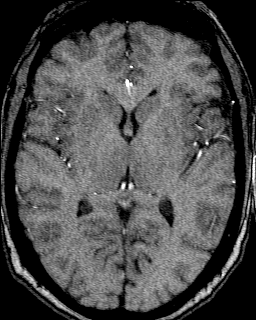
[im 86/90]
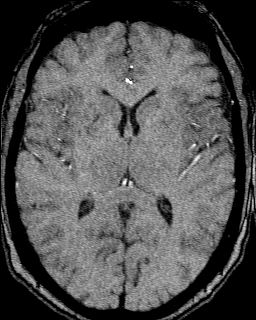
[im 88/90]
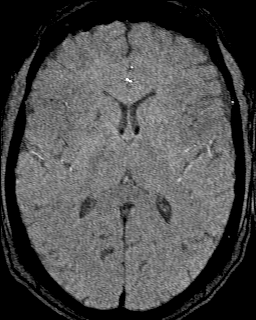
[im 90/90]
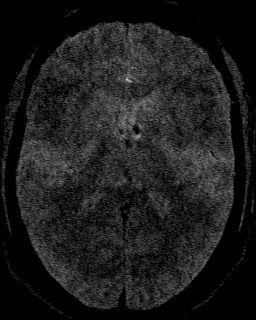

[46 of 48 positions shown; findings below may reference images not displayed]

DIAGNOSTIC STUDIES

EXAM

MR mra head wo con

INDICATION

ABNORMAL CT, HEADACHE
HEADACHE STARTING 3 DAYS AGO, NAUSEA, VOMITING, ABNORMAL CT. Subtle hypodensity involving the
anterior limb right internal capsule and adjacent external capsule. 15 ML GADAVIST RG

TECHNIQUE

MR Ladjane of the brain was performed without IV contrast

COMPARISONS

None

FINDINGS

The distal internal carotid arteries are unremarkable. The distal vertebral and basilar artery is
patent. The proximal anterior, middle, and posterior cerebral arteries are patent. No evidence of
aneurysm is identified.

IMPRESSION

Unremarkable MRI of the brain.

Tech Notes:

HEADACHE STARTING 3 DAYS AGO, NAUSEA, VOMITING, ABNORMAL CT. Subtle hypodensity involving the
anterior limb right internal capsule and adjacent external capsule. 15 ML GADAVIST RG

## 2021-11-10 IMAGING — MR Head^Brain
10 series · 46 of 48 positions shown · IV contrast (with contrast)
Comparison: none

[Series 4: T1 · sagittal · 5.0mm · 0.45mm/px · 3 of 17 slices shown (1 of 2)]
[im 1/17]
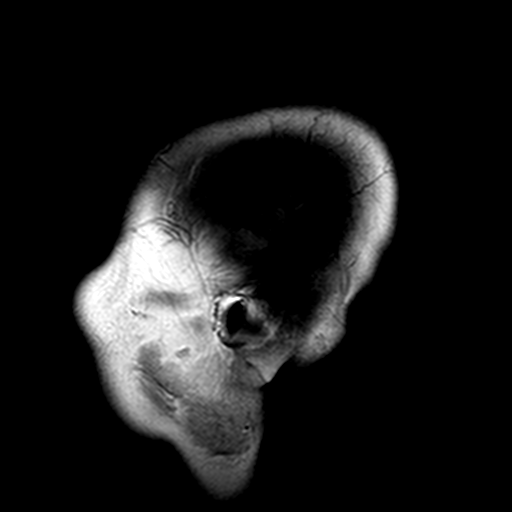
[im 9/17]
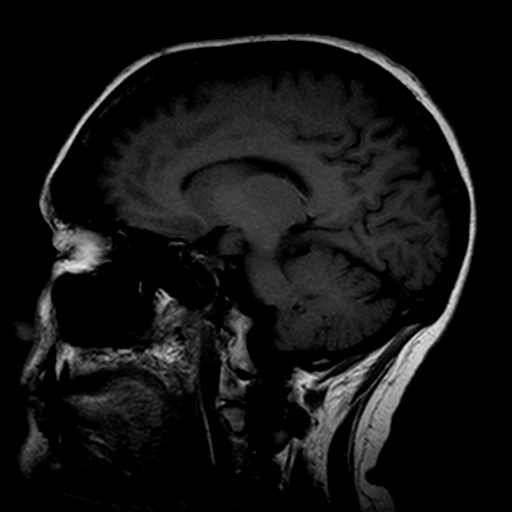
[im 17/17]
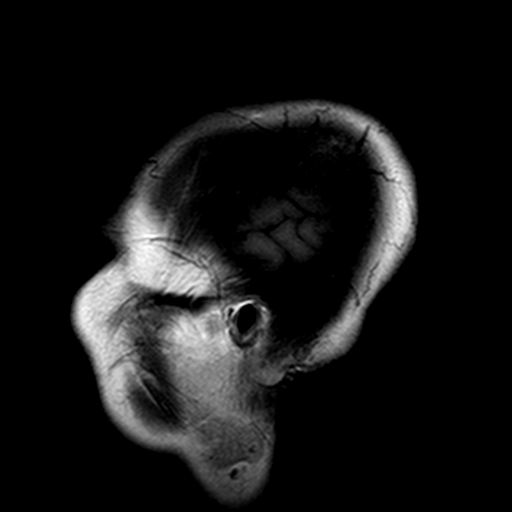

[Series 5: DWI · axial · 5.0mm · 1.80mm/px · z∈[-43,+84]mm · 11 of 55 slices shown (1 of 2)]
[im 1/55]
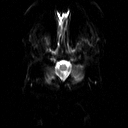
[im 6/55]
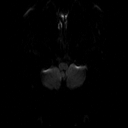
[im 11/55]
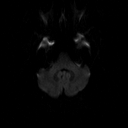
[im 17/55]
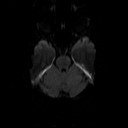
[im 22/55]
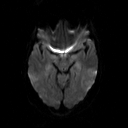
[im 28/55]
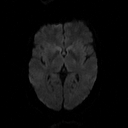
[im 33/55]
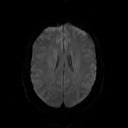
[im 38/55]
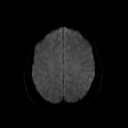
[im 44/55]
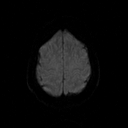
[im 49/55]
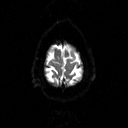
[im 55/55]
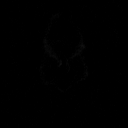

[Series 6: DWI · axial · 5.0mm · 1.80mm/px · z∈[-43,+84]mm · 4 of 21 slices shown (2 of 2)]
[im 1/21]
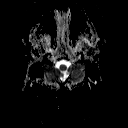
[im 7/21]
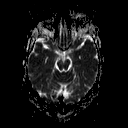
[im 14/21]
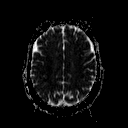
[im 21/21]
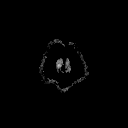

[Series 10: FLAIR · axial · 5.0mm · 0.45mm/px · z∈[-55,+91]mm · 4 of 23 slices shown]
[im 1/23]
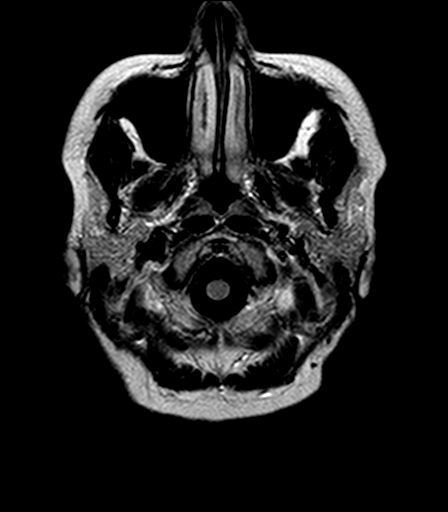
[im 8/23]
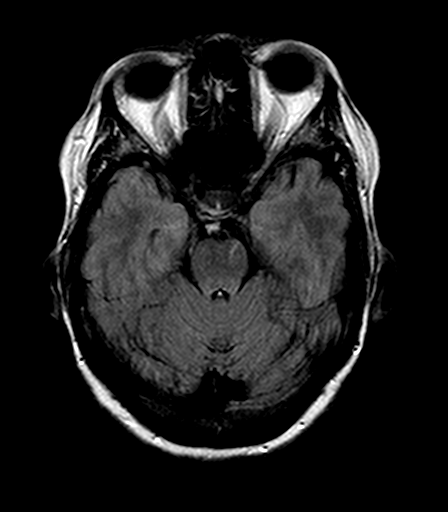
[im 15/23]
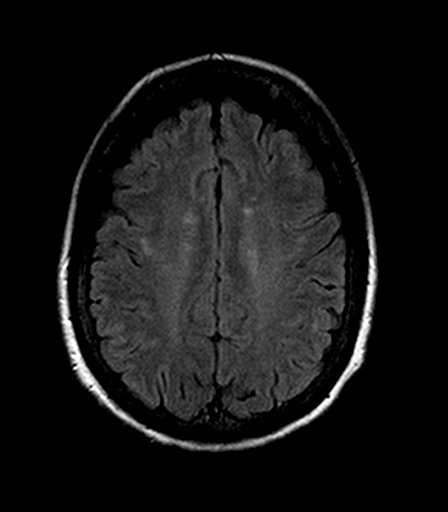
[im 23/23]
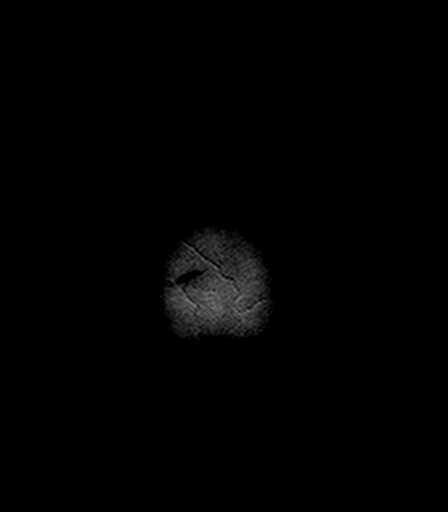

[Series 11: T2 · axial · 5.0mm · 0.72mm/px · z∈[-55,+91]mm · 4 of 23 slices shown (1 of 2)]
[im 1/23]
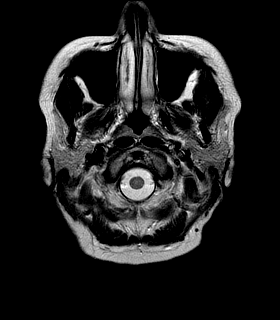
[im 8/23]
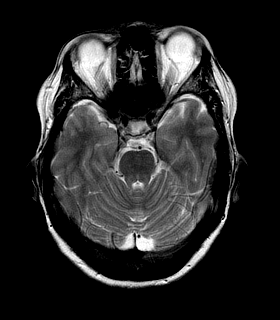
[im 15/23]
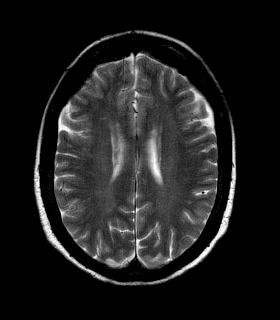
[im 23/23]
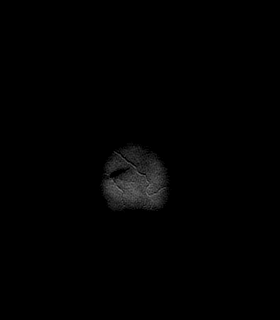

[Series 13: T1 · axial · 5.0mm · 0.45mm/px · z∈[-55,+91]mm · 4 of 21 slices shown (2 of 2)]
[im 1/21]
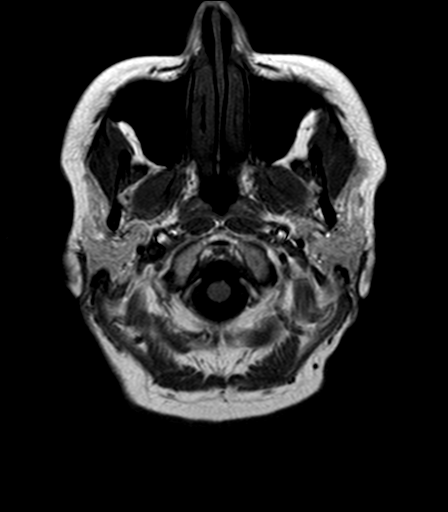
[im 7/21]
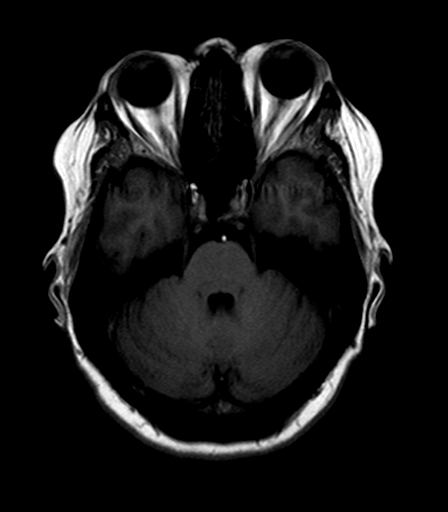
[im 14/21]
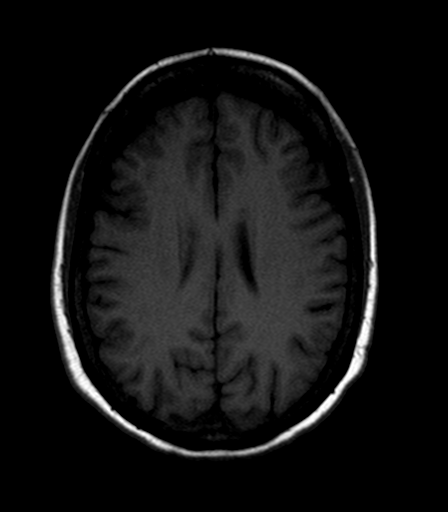
[im 21/21]
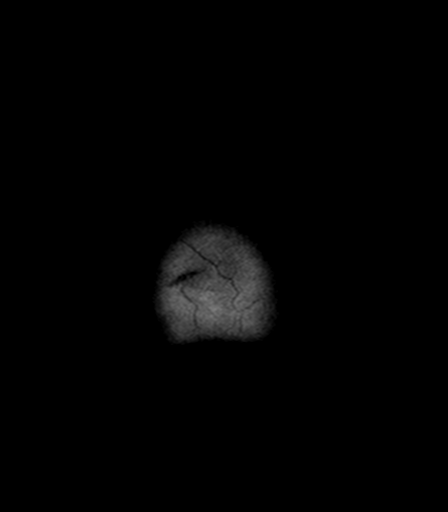

[Series 14: axial blood · axial · 5.0mm · 0.45mm/px · z∈[-55,-10]mm · 2 of 22 slices shown]
[im 1/22]
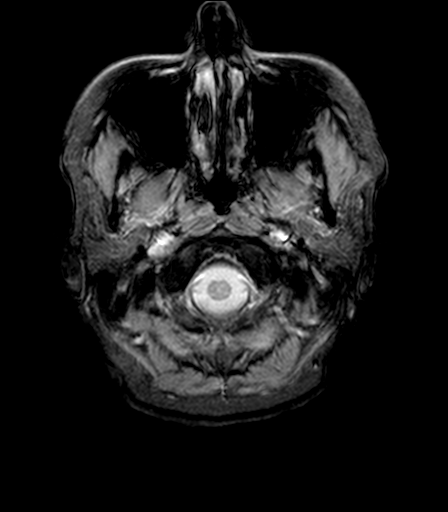
[im 8/22]
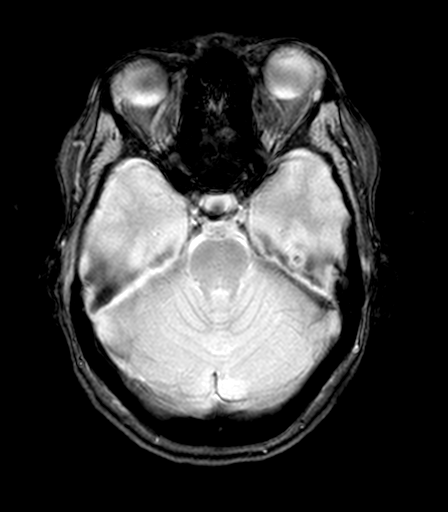

[Series 15: T2 · coronal · 5.0mm · 0.69mm/px · 5 of 24 slices shown (2 of 2)]
[im 1/24]
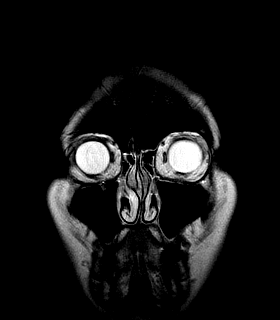
[im 6/24]
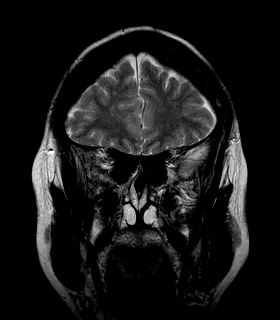
[im 12/24]
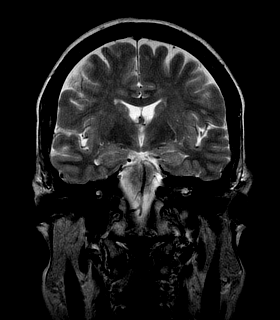
[im 18/24]
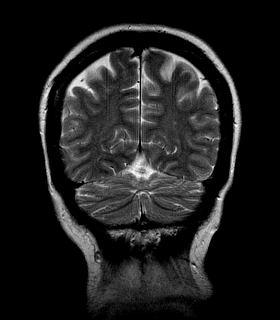
[im 24/24]
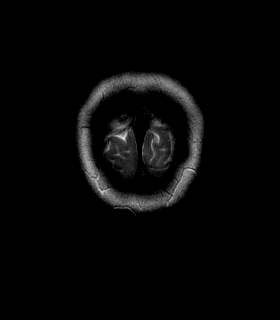

[Series 17: T1 fat-sat post-contrast · axial · 5.0mm · 0.90mm/px · z∈[-49,+91]mm · 4 of 21 slices shown (1 of 2)]
[im 1/21]
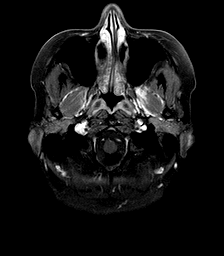
[im 7/21]
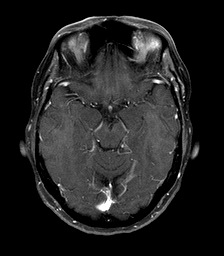
[im 14/21]
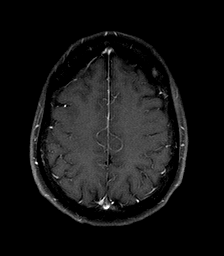
[im 21/21]
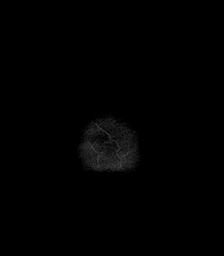

[Series 20: T1 fat-sat post-contrast · coronal · 5.0mm · 0.90mm/px · 5 of 24 slices shown (2 of 2)]
[im 1/24]
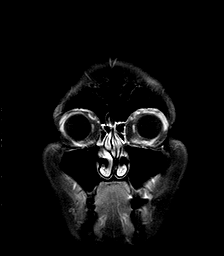
[im 6/24]
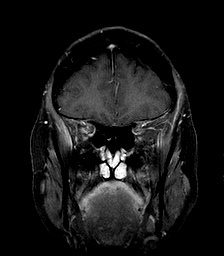
[im 12/24]
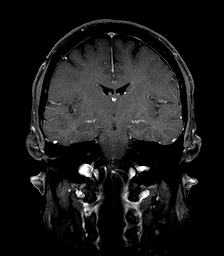
[im 18/24]
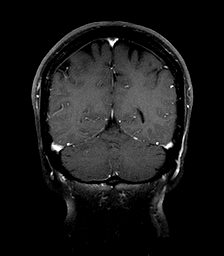
[im 24/24]
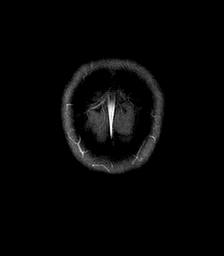

[46 of 48 positions shown; findings below may reference images not displayed]

DIAGNOSTIC STUDIES

EXAM

MR head/brain wo/w con

INDICATION

HA abnormal CT
HEADACHE STARTING 3 DAYS AGO, NAUSEA, VOMITING, ABNORMAL CT. Subtle hypodensity involving the
anterior limb right internal capsule and adjacent external capsule. 15 ML GADAVIST RG

TECHNIQUE

MRI of the brain was performed without and with contrast

COMPARISONS

None

FINDINGS

There is no evidence of acute intracranial hemorrhage or restricted diffusion. The ventricles,
sulci, and cisterns are unremarkable. There is periventricular and deep white matter signal
abnormalities, most pronounced in the right periventricular deep white matter measuring 2.3 x
centimeters. There is no evidence of abnormal enhancement. No intracranial mass. Vascularity is
within normal limits. The sinuses are unremarkable.

IMPRESSION
1. No evidence of acute intracranial hemorrhage or infarct.
2. Periventricular and deep white matter signal abnormalities. Differential considerations would
include chronic small vessel ischemic changes or a demyelinating process such as multiple sclerosis.
There is no abnormal enhancement identified. A 3 month follow-up MRI is recommended to ensure
stability.
3. No intracranial mass or abnormal enhancement.

Tech Notes:

HEADACHE STARTING 3 DAYS AGO, NAUSEA, VOMITING, ABNORMAL CT. Subtle hypodensity involving the
anterior limb right internal capsule and adjacent external capsule. 15 ML GADAVIST RG

## 2021-11-10 IMAGING — CT BRAIN WO(Adult)
3 of 4 series · 14 of 47 positions shown, 16 images · non-contrast
Comparison: none

[Series 4: brain cor 5.00 hr40 s3 · coronal · 0.32mm/px · 3 of 39 slices shown]
[im 13/39  brain]
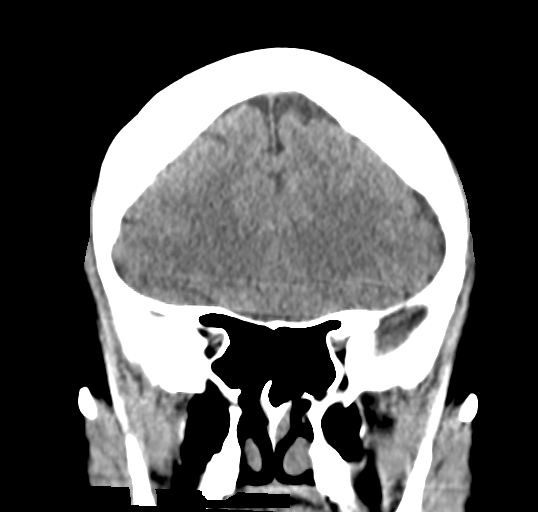
[im 17/39  brain]
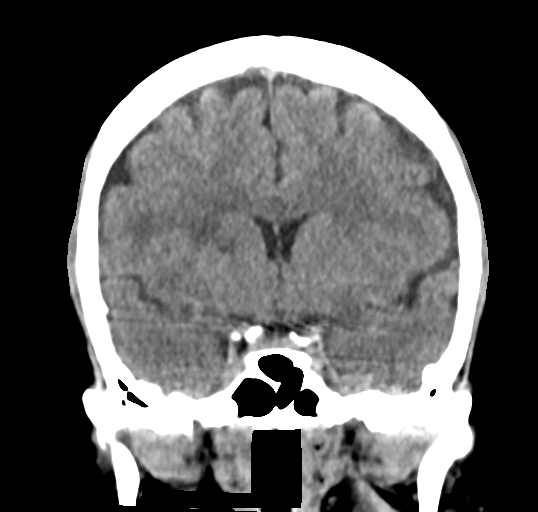
[im 22/39  brain]
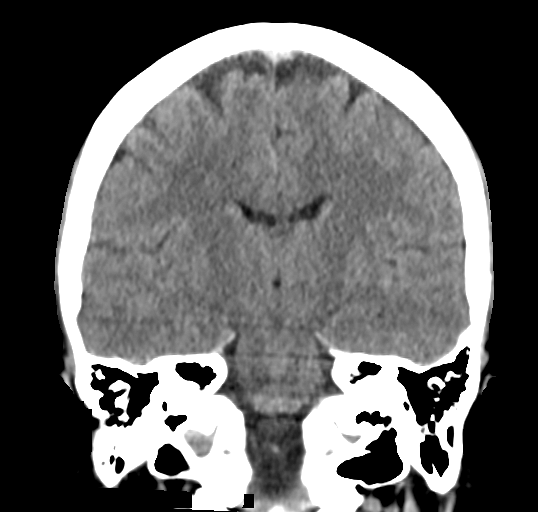

[Series 6: brain sag 5.00 hr40 s3 · sagittal · 0.32mm/px · 3 of 34 slices shown]
[im 12/34  brain]
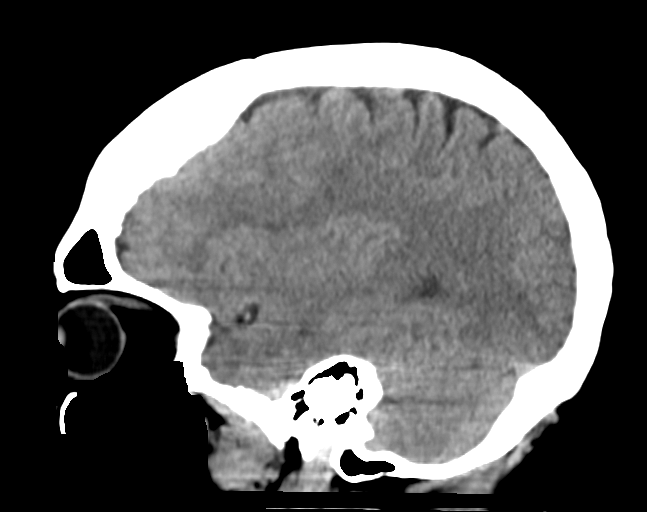
[im 17/34  brain]
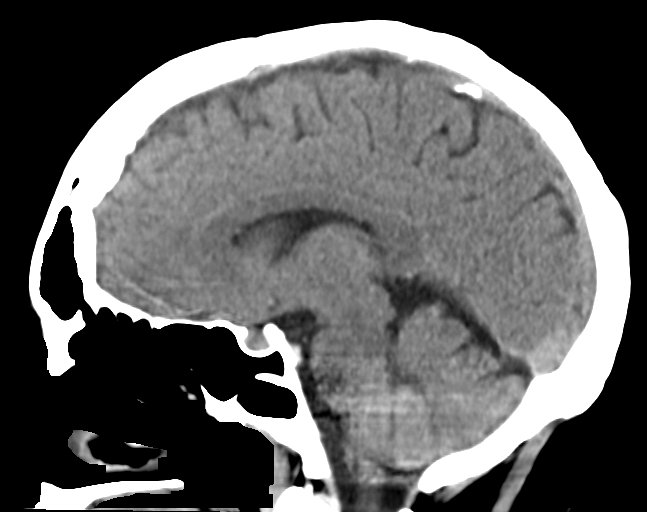
[im 23/34  brain]
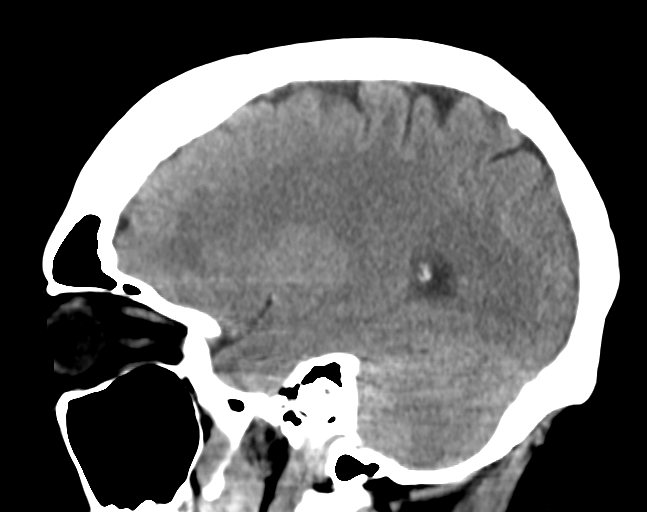

[Series 8: brain ax 2.00 hr60 s3 · axial · 0.33mm/px · z∈[-585,-455]mm · 8 of 81 slices shown, 10 images]
[im 8/81  brain]
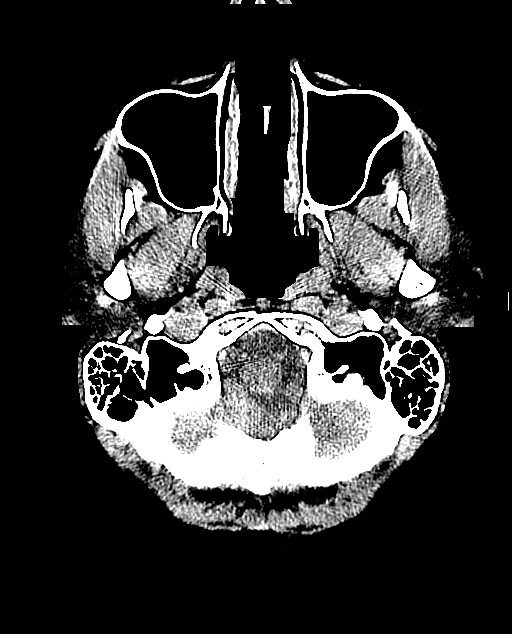
[im 8/81  bone]
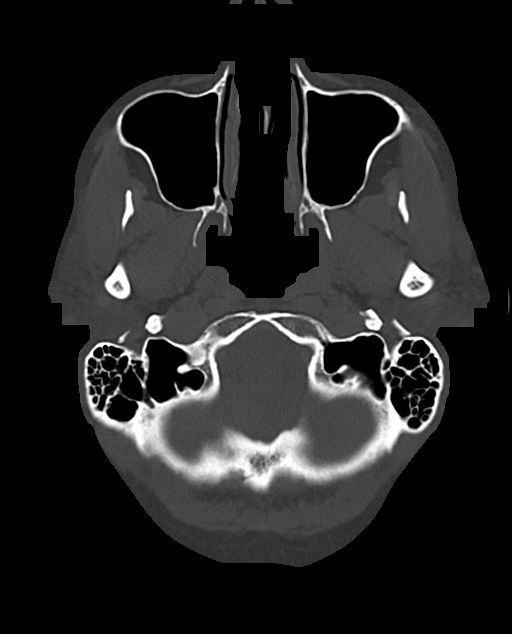
[im 16/81  brain]
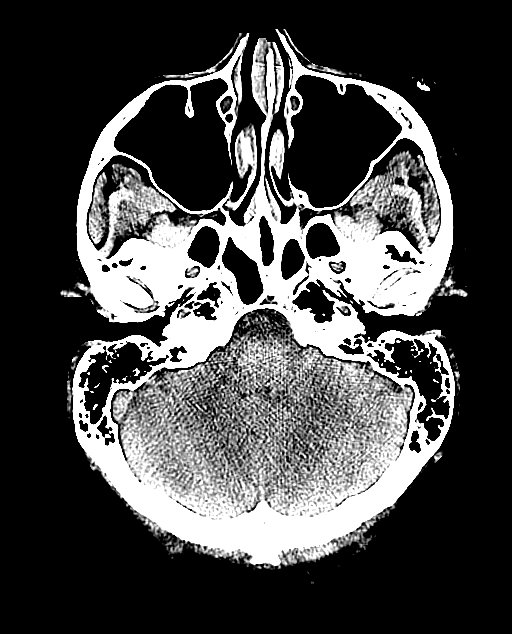
[im 27/81  brain]
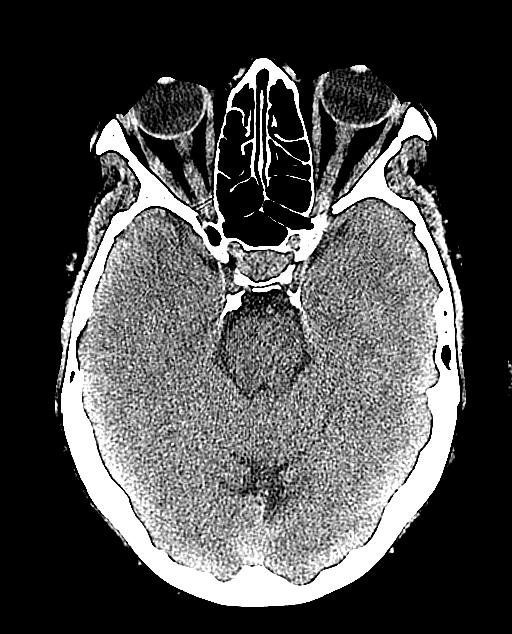
[im 35/81  brain]
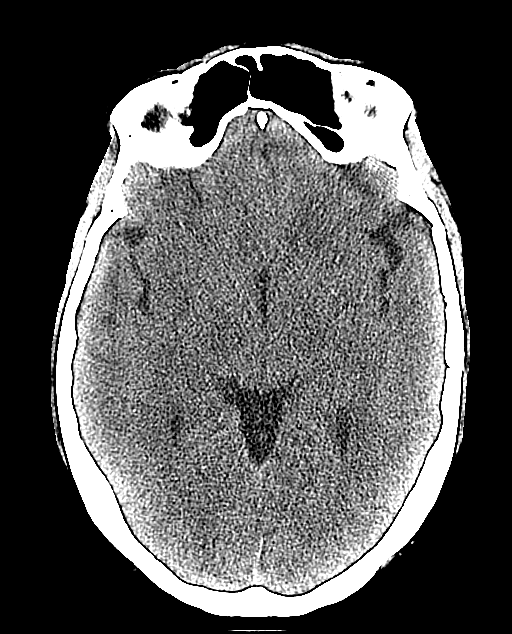
[im 46/81  brain]
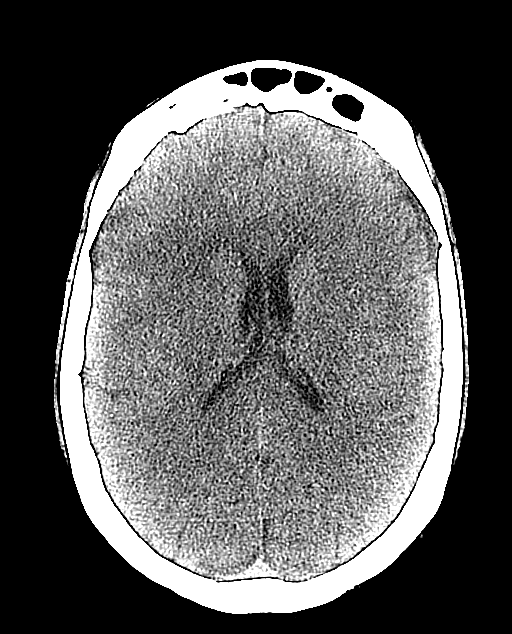
[im 46/81  bone]
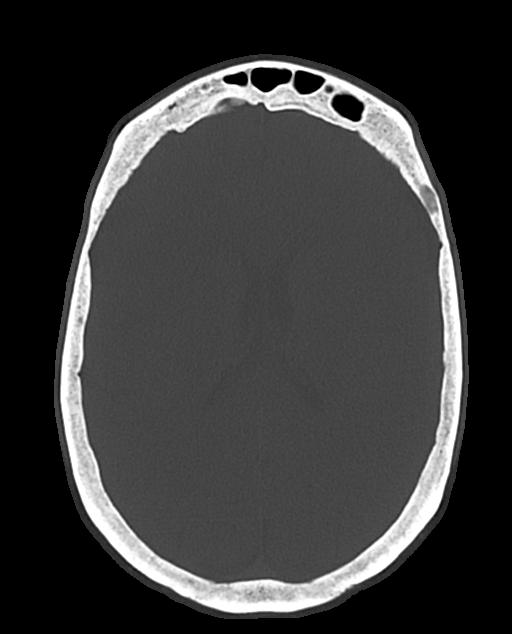
[im 54/81  brain]
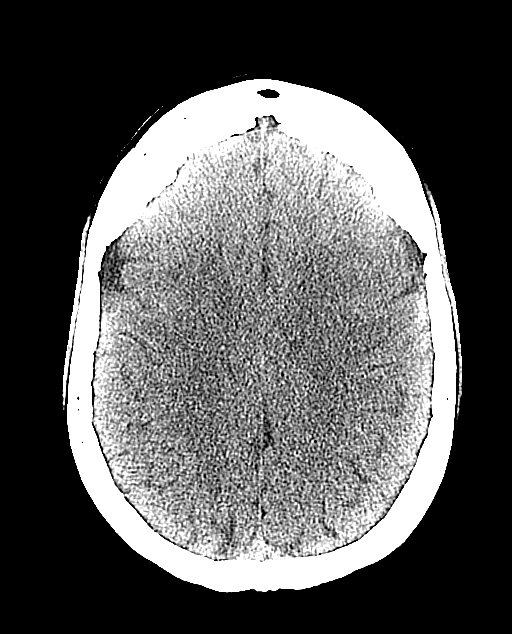
[im 65/81  brain]
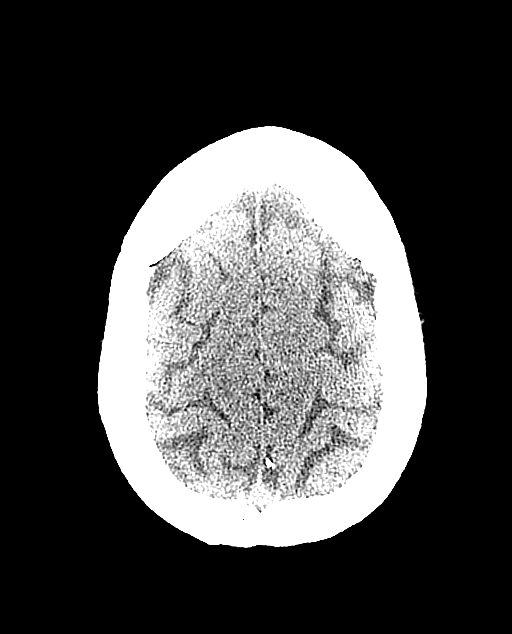
[im 73/81  brain]
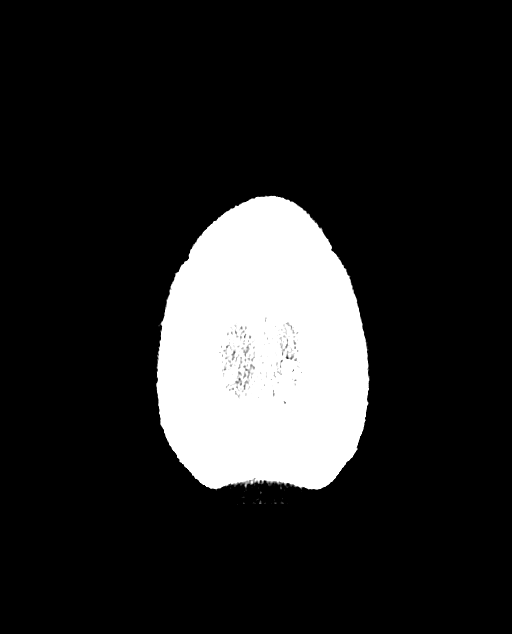

[14 of 47 positions shown; findings below may reference images not displayed]

DIAGNOSTIC STUDIES

EXAM

CT head without contrast

INDICATION

severe headache
LEFT SIDED HEADACHE, NAUSEA, VOMITING, PHOTOPHOBIA.  ACUTE ONSET.

TECHNIQUE

All CT scans at this facility use dose modulation, iterative reconstruction, and/or weight based
dosing when appropriate to reduce radiation dose to as low as reasonably achievable.

Number of previous computed tomography exams in the last 12 months is 0  .

Number of previous nuclear medicine myocardial perfusion studies in the last 12 months is 0  .

COMPARISONS

None available

FINDINGS

No intracranial hemorrhage is seen. Small hypodensity along the anterior limb of the right
internal capsule and external capsule image 18 series 2 is noted likely indicating old infarct or
trauma. Neoplasm is felt unlikely but not excluded. Bony calvarium is normal.

IMPRESSION

Subtle hypodensity involving the anterior limb right internal capsule and adjacent external
capsule. MR with gadolinium is recommended to exclude underlying mass. Report called Dr. Ragland at

Tech Notes:

LEFT SIDED HEADACHE, NAUSEA, VOMITING, PHOTOPHOBIA.  ACUTE ONSET.

## 2022-09-09 ENCOUNTER — Encounter: Admit: 2022-09-09 | Discharge: 2022-09-09 | Payer: Commercial Managed Care - HMO | Primary: Family

## 2023-12-20 ENCOUNTER — Encounter: Admit: 2023-12-20 | Discharge: 2023-12-20 | Payer: Commercial Managed Care - HMO | Primary: Family

## 2024-10-02 ENCOUNTER — Encounter: Admit: 2024-10-02 | Discharge: 2024-10-02 | Payer: PRIVATE HEALTH INSURANCE | Primary: Family
